# Patient Record
Sex: Male | Born: 1990 | Hispanic: Yes | Marital: Single | Smoking: Current every day smoker
Health system: Southern US, Community
[De-identification: ages and names within clinical notes are randomized; demographics above are authoritative.]

## PROBLEM LIST (undated history)

## (undated) DIAGNOSIS — S8251XA Displaced fracture of medial malleolus of right tibia, initial encounter for closed fracture: Secondary | ICD-10-CM

## (undated) DIAGNOSIS — K3580 Unspecified acute appendicitis: Secondary | ICD-10-CM

## (undated) HISTORY — DX: Unspecified acute appendicitis: K35.80

---

## 2017-01-22 ENCOUNTER — Encounter
Admission: EM | Disposition: A | Discharge status: Home / Self Care | Payer: Self-pay | Source: Home / Self Care | Attending: General Surgery

## 2017-01-22 ENCOUNTER — Emergency Department: Payer: Self-pay | Admitting: Certified Registered Nurse Anesthetist

## 2017-01-22 ENCOUNTER — Inpatient Hospital Stay
Admission: EM | Admit: 2017-01-22 | Discharge: 2017-01-25 | DRG: 340 | Disposition: A | Discharge status: Home / Self Care | Payer: Self-pay | Attending: General Surgery | Admitting: General Surgery

## 2017-01-22 ENCOUNTER — Emergency Department: Payer: Self-pay

## 2017-01-22 ENCOUNTER — Encounter: Payer: Self-pay | Admitting: Emergency Medicine

## 2017-01-22 DIAGNOSIS — K3532 Acute appendicitis with perforation and localized peritonitis, without abscess: Secondary | ICD-10-CM | POA: Diagnosis present

## 2017-01-22 DIAGNOSIS — K353 Acute appendicitis with localized peritonitis: Principal | ICD-10-CM | POA: Diagnosis present

## 2017-01-22 DIAGNOSIS — K3580 Unspecified acute appendicitis: Secondary | ICD-10-CM

## 2017-01-22 HISTORY — PX: LAPAROSCOPIC APPENDECTOMY: SHX408

## 2017-01-22 LAB — COMPREHENSIVE METABOLIC PANEL
ALBUMIN: 4.7 g/dL (ref 3.5–5.0)
ALK PHOS: 92 U/L (ref 38–126)
ALT: 32 U/L (ref 17–63)
ANION GAP: 13 (ref 5–15)
AST: 33 U/L (ref 15–41)
BUN: 7 mg/dL (ref 6–20)
CALCIUM: 9.7 mg/dL (ref 8.9–10.3)
CO2: 23 mmol/L (ref 22–32)
Chloride: 100 mmol/L — ABNORMAL LOW (ref 101–111)
Creatinine, Ser: 1.03 mg/dL (ref 0.61–1.24)
GFR calc Af Amer: >60 mL/min (ref >60)
GFR calc non Af Amer: >60 mL/min (ref >60)
GLUCOSE: 148 mg/dL — AB (ref 65–99)
POTASSIUM: 3.7 mmol/L (ref 3.5–5.1)
SODIUM: 136 mmol/L (ref 135–145)
TOTAL PROTEIN: 9.1 g/dL — AB (ref 6.5–8.1)
Total Bilirubin: 1 mg/dL (ref 0.3–1.2)

## 2017-01-22 LAB — CBC
HCT: 47.7 % (ref 40.0–52.0)
HEMOGLOBIN: 16.2 g/dL (ref 13.0–18.0)
MCH: 31.5 pg (ref 26.0–34.0)
MCHC: 34 g/dL (ref 32.0–36.0)
MCV: 92.7 fL (ref 80.0–100.0)
Platelets: 184 10*3/uL (ref 150–440)
RBC: 5.14 MIL/uL (ref 4.40–5.90)
RDW: 12.7 % (ref 11.5–14.5)
WBC: 13.5 10*3/uL — AB (ref 3.8–10.6)

## 2017-01-22 LAB — LIPASE, BLOOD: Lipase: 14 U/L (ref 11–51)

## 2017-01-22 SURGERY — APPENDECTOMY, LAPAROSCOPIC
Anesthesia: General

## 2017-01-22 MED ORDER — LIDOCAINE HCL (CARDIAC) 20 MG/ML IV SOLN
INTRAVENOUS | Status: DC | PRN
  Administered 2017-01-22: 40 mg via INTRAVENOUS

## 2017-01-22 MED ORDER — DEXAMETHASONE SODIUM PHOSPHATE 10 MG/ML IJ SOLN
INTRAMUSCULAR | Status: DC | PRN
  Administered 2017-01-22: 10 mg via INTRAVENOUS

## 2017-01-22 MED ORDER — ROCURONIUM BROMIDE 100 MG/10ML IV SOLN
INTRAVENOUS | Status: DC | PRN
  Administered 2017-01-22: 40 mg via INTRAVENOUS

## 2017-01-22 MED ORDER — LIDOCAINE HCL (PF) 1 % IJ SOLN
INTRAMUSCULAR | Status: AC
  Filled 2017-01-22: qty 30

## 2017-01-22 MED ORDER — LIDOCAINE HCL 1 % IJ SOLN
INTRAMUSCULAR | Status: DC | PRN
  Administered 2017-01-22: 7.5 mL

## 2017-01-22 MED ORDER — FENTANYL CITRATE (PF) 100 MCG/2ML IJ SOLN
75.0000 ug | Freq: Once | INTRAMUSCULAR | Status: AC
  Administered 2017-01-22: 75 ug via INTRAVENOUS
  Filled 2017-01-22: qty 2

## 2017-01-22 MED ORDER — BUPIVACAINE-EPINEPHRINE (PF) 0.25% -1:200000 IJ SOLN
INTRAMUSCULAR | Status: DC | PRN
  Administered 2017-01-22: 7.5 mL via PERINEURAL

## 2017-01-22 MED ORDER — PROPOFOL 10 MG/ML IV BOLUS
INTRAVENOUS | Status: DC | PRN
  Administered 2017-01-22: 160 mg via INTRAVENOUS

## 2017-01-22 MED ORDER — ONDANSETRON HCL 4 MG/2ML IJ SOLN
INTRAMUSCULAR | Status: DC | PRN
  Administered 2017-01-22: 4 mg via INTRAVENOUS

## 2017-01-22 MED ORDER — KETOROLAC TROMETHAMINE 30 MG/ML IJ SOLN
INTRAMUSCULAR | Status: DC | PRN
  Administered 2017-01-22: 30 mg via INTRAVENOUS

## 2017-01-22 MED ORDER — SODIUM CHLORIDE 0.9 % IV BOLUS (SEPSIS)
1000.0000 mL | Freq: Once | INTRAVENOUS | Status: AC
  Administered 2017-01-22: 1000 mL via INTRAVENOUS

## 2017-01-22 MED ORDER — FENTANYL CITRATE (PF) 100 MCG/2ML IJ SOLN
25.0000 ug | INTRAMUSCULAR | Status: DC | PRN
  Administered 2017-01-22: 25 ug via INTRAVENOUS

## 2017-01-22 MED ORDER — LACTATED RINGERS IV SOLN
INTRAVENOUS | Status: DC | PRN
  Administered 2017-01-22: 23:00:00 via INTRAVENOUS

## 2017-01-22 MED ORDER — PIPERACILLIN-TAZOBACTAM 3.375 G IVPB 30 MIN
3.3750 g | Freq: Once | INTRAVENOUS | Status: AC
  Administered 2017-01-22: 3.375 g via INTRAVENOUS

## 2017-01-22 MED ORDER — FENTANYL CITRATE (PF) 100 MCG/2ML IJ SOLN
INTRAMUSCULAR | Status: AC
  Filled 2017-01-22: qty 2

## 2017-01-22 MED ORDER — SUGAMMADEX SODIUM 200 MG/2ML IV SOLN
INTRAVENOUS | Status: DC | PRN
  Administered 2017-01-22: 154.2 mg via INTRAVENOUS

## 2017-01-22 MED ORDER — ONDANSETRON HCL 4 MG/2ML IJ SOLN: 4.0000 mg | Freq: Once | INTRAMUSCULAR | Status: DC | PRN

## 2017-01-22 MED ORDER — SUCCINYLCHOLINE CHLORIDE 20 MG/ML IJ SOLN
INTRAMUSCULAR | Status: DC | PRN
  Administered 2017-01-22: 120 mg via INTRAVENOUS

## 2017-01-22 MED ORDER — PIPERACILLIN-TAZOBACTAM 3.375 G IVPB 30 MIN
INTRAVENOUS | Status: AC
  Administered 2017-01-22: 3.375 g via INTRAVENOUS
  Filled 2017-01-22: qty 50

## 2017-01-22 MED ORDER — FENTANYL CITRATE (PF) 100 MCG/2ML IJ SOLN
25.0000 ug | INTRAMUSCULAR | Status: DC | PRN
Start: 2017-01-22 — End: 2017-01-23
  Administered 2017-01-22 – 2017-01-23 (×5): 25 ug via INTRAVENOUS

## 2017-01-22 MED ORDER — PHENYLEPHRINE HCL 10 MG/ML IJ SOLN
INTRAMUSCULAR | Status: DC | PRN
  Administered 2017-01-22: 100 ug via INTRAVENOUS

## 2017-01-22 MED ORDER — FENTANYL CITRATE (PF) 100 MCG/2ML IJ SOLN
INTRAMUSCULAR | Status: DC | PRN
  Administered 2017-01-22 (×2): 50 ug via INTRAVENOUS

## 2017-01-22 MED ORDER — HYDROMORPHONE HCL 1 MG/ML IJ SOLN
0.5000 mg | Freq: Once | INTRAMUSCULAR | Status: AC
  Administered 2017-01-22: 0.5 mg via INTRAVENOUS
  Filled 2017-01-22: qty 1

## 2017-01-22 MED ORDER — ONDANSETRON HCL 4 MG/2ML IJ SOLN
4.0000 mg | Freq: Once | INTRAMUSCULAR | Status: AC
  Administered 2017-01-22: 4 mg via INTRAVENOUS
  Filled 2017-01-22: qty 2

## 2017-01-22 MED ORDER — SODIUM CHLORIDE 0.9 % IV SOLN
INTRAVENOUS | Status: DC | PRN
  Administered 2017-01-22: 22:00:00 via INTRAVENOUS

## 2017-01-22 MED ORDER — ACETAMINOPHEN 10 MG/ML IV SOLN
INTRAVENOUS | Status: DC | PRN
  Administered 2017-01-22: 1000 mg via INTRAVENOUS

## 2017-01-22 MED ORDER — ACETAMINOPHEN 10 MG/ML IV SOLN
INTRAVENOUS | Status: AC
  Filled 2017-01-22: qty 100

## 2017-01-22 MED ORDER — BUPIVACAINE-EPINEPHRINE (PF) 0.25% -1:200000 IJ SOLN
INTRAMUSCULAR | Status: AC
  Filled 2017-01-22: qty 30

## 2017-01-22 SURGICAL SUPPLY — 46 items
ADHESIVE MASTISOL STRL (MISCELLANEOUS) ×3 IMPLANT
APPLIER CLIP 5 13 M/L LIGAMAX5 (MISCELLANEOUS) ×3
BLADE SURG SZ11 CARB STEEL (BLADE) ×3 IMPLANT
BULB RESERV EVAC DRAIN JP 100C (MISCELLANEOUS) ×3 IMPLANT
CANISTER SUCT 1200ML W/VALVE (MISCELLANEOUS) ×3 IMPLANT
CHLORAPREP W/TINT 26ML (MISCELLANEOUS) ×3 IMPLANT
CLIP APPLIE 5 13 M/L LIGAMAX5 (MISCELLANEOUS) ×1 IMPLANT
CLOSURE WOUND 1/2 X4 (GAUZE/BANDAGES/DRESSINGS) ×1
CUTTER FLEX LINEAR 45M (STAPLE) ×3 IMPLANT
DRAIN CHANNEL JP 19F (MISCELLANEOUS) ×3 IMPLANT
DRSG TEGADERM 2-3/8X2-3/4 SM (GAUZE/BANDAGES/DRESSINGS) ×9 IMPLANT
DRSG TEGADERM 4X4.75 (GAUZE/BANDAGES/DRESSINGS) ×3 IMPLANT
DRSG TELFA 4X3 1S NADH ST (GAUZE/BANDAGES/DRESSINGS) ×3 IMPLANT
ELECT REM PT RETURN 9FT ADLT (ELECTROSURGICAL) ×3
ELECTRODE REM PT RTRN 9FT ADLT (ELECTROSURGICAL) ×1 IMPLANT
GLOVE BIO SURGEON STRL SZ7.5 (GLOVE) ×6 IMPLANT
GLOVE INDICATOR 8.0 STRL GRN (GLOVE) ×6 IMPLANT
GOWN STRL REUS W/ TWL LRG LVL3 (GOWN DISPOSABLE) ×2 IMPLANT
GOWN STRL REUS W/TWL LRG LVL3 (GOWN DISPOSABLE) ×4
IRRIGATION STRYKERFLOW (MISCELLANEOUS) ×1 IMPLANT
IRRIGATOR STRYKERFLOW (MISCELLANEOUS) ×3
IV NS 1000ML (IV SOLUTION) ×2
IV NS 1000ML BAXH (IV SOLUTION) ×1 IMPLANT
KIT RM TURNOVER STRD PROC AR (KITS) ×3 IMPLANT
LABEL OR SOLS (LABEL) IMPLANT
NEEDLE HYPO 25X1 1.5 SAFETY (NEEDLE) ×3 IMPLANT
NEEDLE VERESS 14GA 120MM (NEEDLE) ×3 IMPLANT
NS IRRIG 500ML POUR BTL (IV SOLUTION) ×3 IMPLANT
PACK LAP CHOLECYSTECTOMY (MISCELLANEOUS) ×3 IMPLANT
POUCH SPECIMEN RETRIEVAL 10MM (ENDOMECHANICALS) ×3 IMPLANT
RELOAD 45 VASCULAR/THIN (ENDOMECHANICALS) IMPLANT
RELOAD STAPLE TA45 3.5 REG BLU (ENDOMECHANICALS) ×3 IMPLANT
SCALPEL HARMONIC ACE (MISCELLANEOUS) ×3 IMPLANT
SLEEVE ENDOPATH XCEL 5M (ENDOMECHANICALS) ×3 IMPLANT
SLEEVE SCD COMPRESS THIGH MED (MISCELLANEOUS) ×3 IMPLANT
SPONGE DRAIN TRACH 4X4 STRL 2S (GAUZE/BANDAGES/DRESSINGS) ×3 IMPLANT
STRIP CLOSURE SKIN 1/2X4 (GAUZE/BANDAGES/DRESSINGS) ×2 IMPLANT
SUT ETHILON 3-0 (SUTURE) ×3 IMPLANT
SUT MNCRL 4-0 (SUTURE) ×2
SUT MNCRL 4-0 27XMFL (SUTURE) ×1
SUT VICRYL 0 AB UR-6 (SUTURE) ×3 IMPLANT
SUTURE MNCRL 4-0 27XMF (SUTURE) ×1 IMPLANT
TRAY FOLEY W/METER SILVER 16FR (SET/KITS/TRAYS/PACK) ×3 IMPLANT
TROCAR XCEL 12X100 BLDLESS (ENDOMECHANICALS) ×3 IMPLANT
TROCAR XCEL NON-BLD 5MMX100MML (ENDOMECHANICALS) ×3 IMPLANT
TUBING INSUFFLATOR HI FLOW (MISCELLANEOUS) ×3 IMPLANT

## 2017-01-22 NOTE — Brief Op Note (Signed)
01/22/2017  11:17 PM  PATIENT:  Sean Ayers  26 y.o. male  PRE-OPERATIVE DIAGNOSIS:  appendicitis  POST-OPERATIVE DIAGNOSIS:  Ruptured appendicitis  PROCEDURE:  Procedure(s): APPENDECTOMY LAPAROSCOPIC (N/A)  SURGEON:  Surgeon(s) and Role:    Ricarda Frame* Tiona Ruane, MD - Primary  PHYSICIAN ASSISTANT:   ASSISTANTS: none   ANESTHESIA:   general  EBL:  Total I/O In: 1000 [I.V.:1000] Out: 130 [Urine:100; Blood:30]  BLOOD ADMINISTERED:none  DRAINS: ([id number]) Blake drain(s) in the pelvis   LOCAL MEDICATIONS USED:  MARCAINE   , XYLOCAINE  and Amount: 15 ml  SPECIMEN:  Source of Specimen:  appendix  DISPOSITION OF SPECIMEN:  PATHOLOGY  COUNTS:  YES  TOURNIQUET:  * No tourniquets in log *  DICTATION: .Dragon Dictation  PLAN OF CARE: Admit to inpatient   PATIENT DISPOSITION:  PACU - hemodynamically stable.   Delay start of Pharmacological VTE agent (>24hrs) due to surgical blood loss or risk of bleeding: no

## 2017-01-22 NOTE — ED Notes (Signed)
MD to bedside at this time 

## 2017-01-22 NOTE — Anesthesia Preprocedure Evaluation (Signed)
Anesthesia Evaluation  Patient identified by MRN, date of birth, ID band Patient awake    Reviewed: Allergy & Precautions, NPO status , Patient's Chart, lab work & pertinent test results  Airway Mallampati: II  TM Distance: >3 FB     Dental  (+) Teeth Intact   Pulmonary neg pulmonary ROS,    Pulmonary exam normal        Cardiovascular negative cardio ROS Normal cardiovascular exam     Neuro/Psych negative neurological ROS  negative psych ROS   GI/Hepatic Neg liver ROS,   Endo/Other  negative endocrine ROS  Renal/GU negative Renal ROS  negative genitourinary   Musculoskeletal negative musculoskeletal ROS (+)   Abdominal Normal abdominal exam  (+)   Peds negative pediatric ROS (+)  Hematology negative hematology ROS (+)   Anesthesia Other Findings   Reproductive/Obstetrics                             Anesthesia Physical Anesthesia Plan  ASA: I and emergent  Anesthesia Plan: General   Post-op Pain Management:    Induction: Rapid sequence and Cricoid pressure planned  PONV Risk Score and Plan: 3 and Ondansetron, Dexamethasone, Midazolam and Propofol infusion  Airway Management Planned: Oral ETT  Additional Equipment:   Intra-op Plan:   Post-operative Plan: Extubation in OR  Informed Consent: I have reviewed the patients History and Physical, chart, labs and discussed the procedure including the risks, benefits and alternatives for the proposed anesthesia with the patient or authorized representative who has indicated his/her understanding and acceptance.   Dental advisory given  Plan Discussed with: CRNA and Surgeon  Anesthesia Plan Comments:         Anesthesia Quick Evaluation

## 2017-01-22 NOTE — ED Notes (Signed)
Pt no longer rolling around in the bed yelling "owie owie my stomach is killing me!" lights dimmed for patient comfort, pt given 2 warm blankets. Will continue to monitor for further patient needs. CT at bedside to take patient for CT scan.

## 2017-01-22 NOTE — Transfer of Care (Signed)
Immediate Anesthesia Transfer of Care Note  Patient: Sean Ayers  Procedure(s) Performed: Procedure(s): APPENDECTOMY LAPAROSCOPIC (N/A)  Patient Location: PACU  Anesthesia Type:General  Level of Consciousness: awake, alert  and oriented  Airway & Oxygen Therapy: Patient Spontanous Breathing and Patient connected to face mask oxygen  Post-op Assessment: Report given to RN and Post -op Vital signs reviewed and stable  Post vital signs: Reviewed and stable  Last Vitals:  Vitals:   01/22/17 1940 01/22/17 2000  BP: (!) 142/105 128/80  Pulse: 72 74  Resp: 18   Temp: 36.5 C   SpO2: 100% 100%    Last Pain:  Vitals:   01/22/17 2102  TempSrc:   PainSc: 10-Worst pain ever         Complications: No apparent anesthesia complications

## 2017-01-22 NOTE — Anesthesia Postprocedure Evaluation (Signed)
Anesthesia Post Note  Patient: Sean Ayers  Procedure(s) Performed: Procedure(s) (LRB): APPENDECTOMY LAPAROSCOPIC (N/A)  Patient location during evaluation: PACU Anesthesia Type: General Level of consciousness: awake and alert and oriented Pain management: pain level controlled Vital Signs Assessment: post-procedure vital signs reviewed and stable Respiratory status: spontaneous breathing Cardiovascular status: blood pressure returned to baseline Anesthetic complications: no     Last Vitals:  Vitals:   01/22/17 2329 01/22/17 2330  BP: 138/71   Pulse: 98 93  Resp: (!) 22 (!) 26  Temp: (P) 37.6 C   SpO2: 100% 100%    Last Pain:  Vitals:   01/22/17 2102  TempSrc:   PainSc: 10-Worst pain ever                 Tavon Magnussen

## 2017-01-22 NOTE — Op Note (Signed)
laparascopic appendectomy   Sean Shortsorey Baumgarner Date of operation:  01/22/2017  Indications: The patient presented with a history of  abdominal pain. Workup has revealed findings consistent with acute appendicitis.  Pre-operative Diagnosis: Acute appendicitis with generalized peritonitis  Post-operative Diagnosis: Acute appendicitis with peritoneal abscess  Surgeon: Leonette Mostharles T. Tonita CongWoodham, MD, FACS  Anesthesia: General with endotracheal tube  Procedure Details  The patient was seen again in the preop area. The options of surgery versus observation were reviewed with the patient and/or family. The risks of bleeding, infection, recurrence of symptoms, negative laparoscopy, potential for an open procedure, bowel injury, abscess or infection, were all reviewed as well. The patient was taken to Operating Room, identified as Sean Ayers and the procedure verified as laparoscopic appendectomy. A Time Out was held and the above information confirmed.  The patient was placed in the supine position and general anesthesia was induced.  Antibiotic prophylaxis was administered and VT E prophylaxis was in place. A Foley catheter was placed by the nursing staff.   The abdomen was prepped and draped in a sterile fashion. An infraumbilical incision was made after localization with a 50-50 mixture of 1% lidocaine 0.5% Marcaine plain.. A Veress needle was placed and pneumoperitoneum was obtained. A 5 mm trocar port was placed without difficulty and the abdominal cavity was explored.  Under direct vision a 5 mm suprapubic port was placed and a 12 mm left lateral port was placed all under direct vision.  The appendix was identified and found to be acutely inflamed within the pelvis and adhered to the lateral sidewall. There is copious amounts of inflammatory and purulent fluid within the pelvis and right lower quadrant. As the appendix was dissected out it was found to be perforated in the mid body of the appendix.   The  appendix was carefully dissected. The base of the appendix was dissected out and divided with a standard load Endo GIA. The mesoappendix was divided with the use of a harmonic scalpel. There is an additional area of bleeding along the mesial appendix that was controlled with endoclips.  The appendix was passed out through the left lateral port site with the aid of an Endo Catch bag. The right lower quadrant and pelvis was then irrigated with copious amounts of normal saline which was aspirated. Inspection failed to identify any additional bleeding and there were no signs of bowel injury. Given the ruptured status of the appendix and the copious purulent fluid, the decision was made to place a 19 [place] round Blake drain. It was inserted into the abdomen through the left lower quadrant trocar and pulled out through the low midline. It was placed in the pelvis and the right paracolic gutter under direct visualization. The drain was secured to the skin with a 3-0 nylon.   Again the right lower quadrant was inspected there was no sign of bleeding or bowel injury therefore pneumoperitoneum was released, all ports were removed and re-localized the aforementioned local anesthetic. The left lower quadrant trocar site fascia was closed with a 0 Vicryl suture in a figure-of-eight fashion. The skin incisions were approximated with subcuticular 4-0 Monocryl. Steri-Strips and Mastisol and sterile dressings were placed.  The patient tolerated the procedure well, there were no complications. The sponge lap and needle count were correct at the end of the procedure.  The patient was taken to the recovery room in stable condition to be admitted for continued care.  Findings: Ruptured appendicitis  Estimated Blood Loss: 20 mL  Specimens: appendix         Complications:  None                  Clayburn Pert MD, FACS

## 2017-01-22 NOTE — ED Triage Notes (Signed)
Pt to triage via w/c, diaphoretic, moaning; c/o lower abd pain since this morning with N/V; denies hx of same

## 2017-01-22 NOTE — H&P (Signed)
Patient ID: Sean Ayers, male   DOB: 1990-11-17, 26 y.o.   MRN: [id number]  CC: Abdominal pain  HPI Sean Ayers is a 26 y.o. male who presents to the ER with a 1 day history of abdominal pain. Patient reports the pain started in his lower abdomen and has remained there throughout the day. It has progressively worsened to the point where it prompted him to the emergency department. He has not had any appetite today force himself to take a sip of water earlier today. He has not eaten any food since yesterday. He states he has had subjective fevers and chills but has not taken his temperature. He has had some nausea but denies any vomiting. He denies any chest pain, shortness breath, diarrhea, constipation. Prior to this pain he was in his usual state of health. He denies any recent sick contacts or travel.  HPI  History reviewed. No pertinent past medical history. Patient currently on no medications.  History reviewed. No pertinent surgical history. Patient states he has never had any surgery.  Family history: Uncle with brain cancer of unknown type. No other family history of cardiac disease or diabetes.  Social History Social History  Substance Use Topics  . Smoking status: Never Smoker  . Smokeless tobacco: Never Used  . Alcohol use No    No Known Allergies  Current Facility-Administered Medications  Medication Dose Route Frequency Provider Last Rate Last Dose  . piperacillin-tazobactam (ZOSYN) IVPB 3.375 g  3.375 g Intravenous Once Rockne Menghini, MD 100 mL/hr at 01/22/17 2108 3.375 g at 01/22/17 2108   No current outpatient prescriptions on file.     Review of Systems A Multi-point review of systems was asked and was negative except for the findings documented in the history of present illness  Physical Exam Blood pressure 128/80, pulse 74, temperature 97.7 F (36.5 C), temperature source Oral, resp. rate 18, height  (1.753 m), weight 77.1 kg (170 lb), SpO2 100  %. CONSTITUTIONAL: Resting in bed in obvious discomfort. EYES: Pupils are equal, round, and reactive to light, Sclera are non-icteric. EARS, NOSE, MOUTH AND THROAT: The oropharynx is clear. The oral mucosa is pink and moist. Hearing is intact to voice. LYMPH NODES:  Lymph nodes in the neck are normal. RESPIRATORY:  Lungs are clear. There is normal respiratory effort, with equal breath sounds bilaterally, and without pathologic use of accessory muscles. CARDIOVASCULAR: Heart is regular without murmurs, gallops, or rubs. GI: The abdomen is soft, tender to palpation in bilateral lower quadrants worse in the right lower quadrant, and nondistended. There are no palpable masses. There is no hepatosplenomegaly. There are normal bowel sounds in all quadrants. GU: Rectal deferred.   MUSCULOSKELETAL: Normal muscle strength and tone. No cyanosis or edema.   SKIN: Turgor is good and there are no pathologic skin lesions or ulcers. NEUROLOGIC: Motor and sensation is grossly normal. Cranial nerves are grossly intact. PSYCH:  Oriented to person, place and time. Affect is normal.  Data Reviewed Images and labs reviewed which show a leukocytosis of 13.5, remainder of labs are within normal limits. CT scan of the abdomen performed without contrast shows a dilated, thickened, inflamed appendix in the right side of the pelvis. Due to the petite body habitus and lack of contract it is difficult to ascertain whether not present evidence of rupture or not. I have personally reviewed the patient's imaging, laboratory findings and medical records.    Assessment    Acute appendicitis    Plan  26 year old male with acute appendicitis. Discussed treatment options of surgery versus antibiotic therapy for appendicitis. The risks, benefits, alternatives above described in detail, after which patient states he wants surgery tonight. Discussed the procedure of a laparoscopic appendectomy in details with the patient and his  family were in the room. The primary risk is of damage to the surrounding structures, possible conversion to open, possible need for a drain for rupture, possible need for prolonged hospital stay. The primary benefit is to remove the source of appendicitis. Patient voiced understanding and desires to proceed. We will go to the operating room emergently from the ER for laparoscopic appendectomy. He will then be brought into the hospital postoperatively for continued resuscitation, antibiotics, care. He understands that the length of the stay will be determined by the severity of his appendicitis.     Time spent with the patient was 50 minutes, with more than 50% of the time spent in face-to-face education, counseling and care coordination.     Ricarda Frameharles Haisley Arens, MD FACS General Surgeon 01/22/2017, 9:19 PM

## 2017-01-22 NOTE — ED Notes (Signed)
Dr. Woodham at bedside at this time.  

## 2017-01-22 NOTE — ED Provider Notes (Signed)
[facility] Emergency Department Provider Note  ____________________________________________  Time seen: Approximately 8:07 PM  I have reviewed the triage vital signs and the nursing notes.   HISTORY  Chief Complaint Abdominal Pain    HPI Sean Ayers is a 26 y.o. male , otherwise healthy, presenting with lower abdominal pain, nausea and vomiting, and stool. The patient reports that since this morning, he has had progressively worsening sharp lower abdominal pains associated with multiple episodes of vomiting and loose, nonbloody stool. He has not had any fever or chills. He has not had any urinary symptoms, testicular or scrotal pain.   History reviewed. No pertinent past medical history.  There are no active problems to display for this patient.   History reviewed. No pertinent surgical history.    Allergies Patient has no known allergies.  No family history on file.  Social History Social History  Substance Use Topics  . Smoking status: Never Smoker  . Smokeless tobacco: Never Used  . Alcohol use No    Review of Systems Constitutional: No fever/chills.No lightheadedness or syncope. Eyes: No visual changes. ENT: No sore throat. No congestion or rhinorrhea. Cardiovascular: Denies chest pain. Denies palpitations. Respiratory: Denies shortness of breath.  No cough. Gastrointestinal: Positive lower abdominal pain.  Positive nausea, positive vomiting.  Positive diarrhea.  No constipation. Genitourinary: Negative for dysuria. No penile, scrotal or testicular pain. Musculoskeletal: Negative for back pain. Skin: Negative for rash. Neurological: Negative for headaches. No focal numbness, tingling or weakness.     ____________________________________________   PHYSICAL EXAM:  VITAL SIGNS: ED Triage Vitals  Enc Vitals Group     BP 01/22/17 1940 (!) 142/105     Pulse Rate 01/22/17 1940 72     Resp 01/22/17 1940 18     Temp 01/22/17  1940 97.7 F (36.5 C)     Temp Source 01/22/17 1940 Oral     SpO2 01/22/17 1940 100 %     Weight 01/22/17 1940 170 lb (77.1 kg)     Height 01/22/17 1940 5\' 9"  (1.753 m)     Head Circumference --      Peak Flow --      Pain Score 01/22/17 1941 10     Pain Loc --      Pain Edu? --      Excl. in GC? --     Constitutional: Alert and oriented. Uncomfortable appearing and doubled over, has difficulty laying flat for my examination.Marland Kitchen. Answers questions appropriately. Eyes: Conjunctivae are normal.  EOMI. No scleral icterus. Head: Atraumatic. Nose: No congestion/rhinnorhea. Mouth/Throat: Mucous membranes are moist.  Neck: No stridor.  Supple.   Cardiovascular: Normal rate, regular rhythm. No murmurs, rubs or gallops.  Respiratory: Normal respiratory effort.  No accessory muscle use or retractions. Lungs CTAB.  No wheezes, rales or ronchi. Gastrointestinal: Soft, and nondistended.  Diffuse tenderness to palpation in the lower abdomen without focality. No guarding or rebound.  No peritoneal signs. Genitourinary: Normal-appearing male genitalia. No discharge or from the penis or lesions on the penis. Normal lie of the testicles. No tenderness to palpation or palpable masses on the testicles. Negative for inguinal hernias bilaterally. Musculoskeletal: No LE edema.  Neurologic:  A&Ox3.  Speech is clear.  Face and smile are symmetric.  EOMI.  Moves all extremities well. Skin:  Skin is warm, dry and intact. No rash noted. Psychiatric: Mood and affect are normal. Speech and behavior are normal.  Normal judgement.  ____________________________________________   LABS (all labs ordered are  listed, but only abnormal results are displayed)  Labs Reviewed  CBC - Abnormal; Notable for the following:       Result Value   WBC 13.5 (*)    All other components within normal limits  COMPREHENSIVE METABOLIC PANEL - Abnormal; Notable for the following:    Chloride 100 (*)    Glucose, Bld 148 (*)    Total  Protein 9.1 (*)    All other components within normal limits  LIPASE, BLOOD  URINALYSIS, COMPLETE (UACMP) WITH MICROSCOPIC   ____________________________________________  EKG  Not indicated ____________________________________________  RADIOLOGY  Ct Renal Stone Study  Result Date: 01/22/2017 CLINICAL DATA:  Lower abdomen pain since this morning with nausea and vomiting. EXAM: CT ABDOMEN AND PELVIS WITHOUT CONTRAST TECHNIQUE: Multidetector CT imaging of the abdomen and pelvis was performed following the standard protocol without IV contrast. COMPARISON:  None. FINDINGS: Lower chest: No acute abnormality. Hepatobiliary: No focal liver abnormality is seen. No gallstones, gallbladder wall thickening, or biliary dilatation. Pancreas: Unremarkable. No pancreatic ductal dilatation or surrounding inflammatory changes. Spleen: Normal in size without focal abnormality. Adrenals/Urinary Tract: Adrenal glands are unremarkable. Kidneys are normal, without renal calculi, focal lesion, or hydronephrosis. Bladder is unremarkable. Stomach/Bowel: There is a blind ending tubular structure extending from the cecum with thickened enhancing wall and surrounding fluid and inflammation. This is consistent with appendicitis. There is no small bowel obstruction or diverticulitis. There is no free air. Vascular/Lymphatic: No significant vascular findings are present. No enlarged abdominal or pelvic lymph nodes. Reproductive: Prostate is unremarkable. Other: None. Musculoskeletal: No acute or significant osseous findings. IMPRESSION: Findings consistent with appendicitis. These results will be called to the ordering clinician or representative by the Radiologist Assistant, and communication documented in the PACS or zVision Dashboard. Electronically Signed   By: Sherian Rein M.D.   On: 01/22/2017 20:33    ____________________________________________   PROCEDURES  Procedure(s) performed: None  Procedures  Critical  Care performed: No ____________________________________________   INITIAL IMPRESSION / ASSESSMENT AND PLAN / ED COURSE  Pertinent labs & imaging results that were available during my care of the patient were reviewed by me and considered in my medical decision making (see chart for details).  26 y.o. L with progressively worsening lower abdominal pain associated nausea vomiting and diarrhea. It is possible that the patient has a viral or foodborne GI illness. I would also consider appendicitis. Gallbladder disease is less likely given that the pain is in the lower abdomen. I do not see any evidence of a GU pathology for the patient's symptoms. Plan CT scan, laboratory studies, and symptomatic treatment. Plan reevaluation for final disposition.  ----------------------------------------- 8:54 PM on 01/22/2017 -----------------------------------------  The patient's CT scan is positive for acute appendicitis. Dr. Tonita Cong, the general surgeon on call, has been notified and will admit the patient for further management. The patient will be given Zosyn. At this time, his symptoms have improved but he continues to have some pain so will continue to treat his pain.  ____________________________________________  FINAL CLINICAL IMPRESSION(S) / ED DIAGNOSES  Final diagnoses:  Acute appendicitis, unspecified acute appendicitis type         NEW MEDICATIONS STARTED DURING THIS VISIT:  New Prescriptions   No medications on file      Rockne Menghini, MD 01/22/17 2055

## 2017-01-22 NOTE — Anesthesia Procedure Notes (Signed)
Procedure Name: Intubation Performed by: Demetrius Charity Pre-anesthesia Checklist: Patient identified, Patient being monitored, Timeout performed, Emergency Drugs available and Suction available Patient Re-evaluated:Patient Re-evaluated prior to induction Oxygen Delivery Method: Circle system utilized Preoxygenation: Pre-oxygenation with 100% oxygen Induction Type: IV induction, Rapid sequence and Cricoid Pressure applied Laryngoscope Size: Mac and 4 Grade View: Grade I Tube type: Oral Tube size: 7.5 mm Number of attempts: 1 Airway Equipment and Method: Stylet Placement Confirmation: ETT inserted through vocal cords under direct vision,  positive ETCO2 and breath sounds checked- equal and bilateral Secured at: 23 cm Tube secured with: Tape Dental Injury: Teeth and Oropharynx as per pre-operative assessment

## 2017-01-22 NOTE — Anesthesia Post-op Follow-up Note (Signed)
Anesthesia QCDR form completed.        

## 2017-01-23 ENCOUNTER — Encounter: Payer: Self-pay | Admitting: General Surgery

## 2017-01-23 LAB — BASIC METABOLIC PANEL
ANION GAP: 7 (ref 5–15)
BUN: 9 mg/dL (ref 6–20)
CHLORIDE: 103 mmol/L (ref 101–111)
CO2: 26 mmol/L (ref 22–32)
CREATININE: 0.98 mg/dL (ref 0.61–1.24)
Calcium: 8.8 mg/dL — ABNORMAL LOW (ref 8.9–10.3)
GFR calc non Af Amer: >60 mL/min (ref >60)
Glucose, Bld: 187 mg/dL — ABNORMAL HIGH (ref 65–99)
POTASSIUM: 4 mmol/L (ref 3.5–5.1)
SODIUM: 136 mmol/L (ref 135–145)

## 2017-01-23 LAB — CBC
HEMATOCRIT: 40.2 % (ref 40.0–52.0)
Hemoglobin: 13.9 g/dL (ref 13.0–18.0)
MCH: 32.1 pg (ref 26.0–34.0)
MCHC: 34.6 g/dL (ref 32.0–36.0)
MCV: 92.8 fL (ref 80.0–100.0)
Platelets: 140 10*3/uL — ABNORMAL LOW (ref 150–440)
RBC: 4.33 MIL/uL — ABNORMAL LOW (ref 4.40–5.90)
RDW: 12.8 % (ref 11.5–14.5)
WBC: 15.6 10*3/uL — ABNORMAL HIGH (ref 3.8–10.6)

## 2017-01-23 MED ORDER — NICOTINE 14 MG/24HR TD PT24
14.0000 mg | MEDICATED_PATCH | Freq: Every day | TRANSDERMAL | Status: DC
  Administered 2017-01-23 – 2017-01-24 (×2): 14 mg via TRANSDERMAL
  Filled 2017-01-23 (×2): qty 1

## 2017-01-23 MED ORDER — DIPHENHYDRAMINE HCL 50 MG/ML IJ SOLN: 25.0000 mg | Freq: Four times a day (QID) | INTRAMUSCULAR | Status: DC | PRN

## 2017-01-23 MED ORDER — MORPHINE SULFATE (PF) 4 MG/ML IV SOLN
4.0000 mg | INTRAVENOUS | Status: DC | PRN
  Administered 2017-01-23 (×3): 4 mg via INTRAVENOUS
  Filled 2017-01-23 (×3): qty 1

## 2017-01-23 MED ORDER — FENTANYL CITRATE (PF) 100 MCG/2ML IJ SOLN
INTRAMUSCULAR | Status: AC
  Filled 2017-01-23: qty 2

## 2017-01-23 MED ORDER — DEXTROSE IN LACTATED RINGERS 5 % IV SOLN
INTRAVENOUS | Status: DC
  Administered 2017-01-23 – 2017-01-24 (×3): via INTRAVENOUS

## 2017-01-23 MED ORDER — PANTOPRAZOLE SODIUM 40 MG IV SOLR
40.0000 mg | INTRAVENOUS | Status: DC
  Administered 2017-01-23 – 2017-01-24 (×2): 40 mg via INTRAVENOUS
  Filled 2017-01-23 (×2): qty 40

## 2017-01-23 MED ORDER — ONDANSETRON 4 MG PO TBDP: 4.0000 mg | ORAL_TABLET | Freq: Four times a day (QID) | ORAL | Status: DC | PRN

## 2017-01-23 MED ORDER — PNEUMOCOCCAL VAC POLYVALENT 25 MCG/0.5ML IJ INJ: 0.5000 mL | INJECTION | INTRAMUSCULAR | Status: DC

## 2017-01-23 MED ORDER — PIPERACILLIN-TAZOBACTAM 3.375 G IVPB
3.3750 g | Freq: Three times a day (TID) | INTRAVENOUS | Status: DC
  Administered 2017-01-23 – 2017-01-24 (×5): 3.375 g via INTRAVENOUS
  Filled 2017-01-23 (×5): qty 50

## 2017-01-23 MED ORDER — HYDRALAZINE HCL 20 MG/ML IJ SOLN: 10.0000 mg | INTRAMUSCULAR | Status: DC | PRN

## 2017-01-23 MED ORDER — ENOXAPARIN SODIUM 40 MG/0.4ML ~~LOC~~ SOLN
40.0000 mg | SUBCUTANEOUS | Status: DC
  Administered 2017-01-23 – 2017-01-24 (×2): 40 mg via SUBCUTANEOUS
  Filled 2017-01-23 (×2): qty 0.4

## 2017-01-23 MED ORDER — HYDROMORPHONE HCL 1 MG/ML IJ SOLN
0.5000 mg | INTRAMUSCULAR | Status: DC | PRN
  Administered 2017-01-23 – 2017-01-25 (×8): 0.5 mg via INTRAVENOUS
  Filled 2017-01-23 (×8): qty 0.5

## 2017-01-23 MED ORDER — ONDANSETRON HCL 4 MG/2ML IJ SOLN: 4.0000 mg | Freq: Four times a day (QID) | INTRAMUSCULAR | Status: DC | PRN

## 2017-01-23 MED ORDER — GI COCKTAIL ~~LOC~~
30.0000 mL | Freq: Once | ORAL | Status: AC
  Administered 2017-01-23: 30 mL via ORAL
  Filled 2017-01-23: qty 30

## 2017-01-23 MED ORDER — OXYCODONE HCL 5 MG PO TABS
5.0000 mg | ORAL_TABLET | ORAL | Status: DC | PRN
  Administered 2017-01-23 (×4): 5 mg via ORAL
  Administered 2017-01-24 – 2017-01-25 (×5): 10 mg via ORAL
  Filled 2017-01-23: qty 1
  Filled 2017-01-23 (×2): qty 2
  Filled 2017-01-23 (×3): qty 1
  Filled 2017-01-23 (×3): qty 2

## 2017-01-23 MED ORDER — DIPHENHYDRAMINE HCL 25 MG PO CAPS: 25.0000 mg | ORAL_CAPSULE | Freq: Four times a day (QID) | ORAL | Status: DC | PRN

## 2017-01-23 NOTE — Progress Notes (Signed)
01/23/2017  Subjective: Patient is 1 Day Post-Op s/p laparoscopic appendectomy.  Reports he feels much better now compared to prior to surgery.  Had mild nausea post-op but feels ok this morning.  Vital signs: Temp:  [97.7 F (36.5 C)-99.7 F (37.6 C)] 99.3 F (37.4 C) (09/07 1220) Pulse Rate:  [63-98] 80 (09/07 1220) Resp:  [15-26] 18 (09/07 0525) BP: (97-142)/(47-105) 125/63 (09/07 1220) SpO2:  [94 %-100 %] 100 % (09/07 1220) Weight:  [77.1 kg (170 lb)] 77.1 kg (170 lb) (09/06 1940)   Intake/Output: 09/06 0701 - 09/07 0700 In: 1848 [P.O.:148; I.V.:1700] Out: 780 [Urine:700; Drains:50; Blood:30] Last BM Date: 01/22/17  Physical Exam: Constitutional: No acute distress Abdomen:  Soft, nondistended, appropriately tender to palpation.  Incisions are clean, dry.  JP drain with serosanguinous fluid.  Labs:   Recent Labs  01/22/17 1951 01/23/17 0429  WBC 13.5* 15.6*  HGB 16.2 13.9  HCT 47.7 40.2  PLT 184 140*    Recent Labs  01/22/17 1951 01/23/17 0429  NA 136 136  K 3.7 4.0  CL 100* 103  CO2 23 26  GLUCOSE 148* 187*  BUN 7 9  CREATININE 1.03 0.98  CALCIUM 9.7 8.8*   No results for input(s): LABPROT, INR in the last 72 hours.  Imaging: Ct Renal Stone Study  Result Date: 01/22/2017 CLINICAL DATA:  Lower abdomen pain since this morning with nausea and vomiting. EXAM: CT ABDOMEN AND PELVIS WITHOUT CONTRAST TECHNIQUE: Multidetector CT imaging of the abdomen and pelvis was performed following the standard protocol without IV contrast. COMPARISON:  None. FINDINGS: Lower chest: No acute abnormality. Hepatobiliary: No focal liver abnormality is seen. No gallstones, gallbladder wall thickening, or biliary dilatation. Pancreas: Unremarkable. No pancreatic ductal dilatation or surrounding inflammatory changes. Spleen: Normal in size without focal abnormality. Adrenals/Urinary Tract: Adrenal glands are unremarkable. Kidneys are normal, without renal calculi, focal lesion, or  hydronephrosis. Bladder is unremarkable. Stomach/Bowel: There is a blind ending tubular structure extending from the cecum with thickened enhancing wall and surrounding fluid and inflammation. This is consistent with appendicitis. There is no small bowel obstruction or diverticulitis. There is no free air. Vascular/Lymphatic: No significant vascular findings are present. No enlarged abdominal or pelvic lymph nodes. Reproductive: Prostate is unremarkable. Other: None. Musculoskeletal: No acute or significant osseous findings. IMPRESSION: Findings consistent with appendicitis. These results will be called to the ordering clinician or representative by the Radiologist Assistant, and communication documented in the PACS or zVision Dashboard. Electronically Signed   By: Sherian ReinWei-Chen  Lin M.D.   On: 01/22/2017 20:33    Assessment/Plan: 26 yo male s/p laparoscopic appendectomy  --Clear liquid diet today --Continue IV Zosyn for 24-48 hrs until WBC normalizes --Decrease IV fluid today and d/c when taking good po --OOB, ambulate.   Howie IllJose Luis Leni Pankonin, MD [facility]

## 2017-01-24 LAB — CBC
HCT: 37.3 % — ABNORMAL LOW (ref 40.0–52.0)
HEMOGLOBIN: 13.2 g/dL (ref 13.0–18.0)
MCH: 32.7 pg (ref 26.0–34.0)
MCHC: 35.5 g/dL (ref 32.0–36.0)
MCV: 92.3 fL (ref 80.0–100.0)
Platelets: 136 10*3/uL — ABNORMAL LOW (ref 150–440)
RBC: 4.04 MIL/uL — ABNORMAL LOW (ref 4.40–5.90)
RDW: 12.8 % (ref 11.5–14.5)
WBC: 13 10*3/uL — ABNORMAL HIGH (ref 3.8–10.6)

## 2017-01-24 LAB — BASIC METABOLIC PANEL WITH GFR
Anion gap: 7 (ref 5–15)
BUN: 8 mg/dL (ref 6–20)
CO2: 26 mmol/L (ref 22–32)
Calcium: 8.8 mg/dL — ABNORMAL LOW (ref 8.9–10.3)
Chloride: 103 mmol/L (ref 101–111)
Creatinine, Ser: 0.85 mg/dL (ref 0.61–1.24)
GFR calc Af Amer: >60 mL/min
GFR calc non Af Amer: >60 mL/min
Glucose, Bld: 117 mg/dL — ABNORMAL HIGH (ref 65–99)
Potassium: 3.8 mmol/L (ref 3.5–5.1)
Sodium: 136 mmol/L (ref 135–145)

## 2017-01-24 MED ORDER — AMOXICILLIN-POT CLAVULANATE 875-125 MG PO TABS
1.0000 | ORAL_TABLET | Freq: Two times a day (BID) | ORAL | Status: DC
  Administered 2017-01-24: 1 via ORAL
  Filled 2017-01-24: qty 1

## 2017-01-24 MED ORDER — KETOROLAC TROMETHAMINE 30 MG/ML IJ SOLN
30.0000 mg | Freq: Four times a day (QID) | INTRAMUSCULAR | Status: DC
  Administered 2017-01-24 – 2017-01-25 (×5): 30 mg via INTRAVENOUS
  Filled 2017-01-24 (×5): qty 1

## 2017-01-24 NOTE — Progress Notes (Signed)
01/24/2017  Subjective: Patient is 2 Days Post-Op s/p laparoscopic appendectomy.  No acute events overnight.  Yesterday had complaints of chest pain / gas pain.  EKG was normal, and pain resolved with GI cocktail.  Tolerated clears well.  Vital signs: Temp:  [98 F (36.7 C)-99.3 F (37.4 C)] 98 F (36.7 C) (09/08 0516) Pulse Rate:  [65-85] 67 (09/08 0516) Resp:  [16-20] 18 (09/08 0516) BP: (111-135)/(60-77) 127/64 (09/08 0516) SpO2:  [100 %] 100 % (09/08 0516)   Intake/Output: 09/07 0701 - 09/08 0700 In: 2796 [P.O.:720; I.V.:1957; IV Piggyback:119] Out: 1120 [Urine:1100; Drains:20] Last BM Date: 01/22/17  Physical Exam: Constitutional:  No acute distress Abdomen:  Soft, nondistended, appropriately tender to palpation.  Incisions clean, dry, intact with dressings in place.  JP drain with serosanguinous fluid.  Labs:   Recent Labs  01/23/17 0429 01/24/17 0424  WBC 15.6* 13.0*  HGB 13.9 13.2  HCT 40.2 37.3*  PLT 140* 136*    Recent Labs  01/23/17 0429 01/24/17 0424  NA 136 136  K 4.0 3.8  CL 103 103  CO2 26 26  GLUCOSE 187* 117*  BUN 9 8  CREATININE 0.98 0.85  CALCIUM 8.8* 8.8*   No results for input(s): LABPROT, INR in the last 72 hours.  Imaging: No results found.  Assessment/Plan: 26 yo male s/p laparoscopic appendectomy.  --WBC still elevated but improving.  Will continue IV Zosyn today and likely transition to oral tonight. --Advance to full liquid diet today and possibly regular diet tonight. --Add toradol for pain control. --OOB, ambulate. --anticipate discharge tomorrow if WBC improved.   Sean IllJose Luis Jevan Gaunt, MD [facility]

## 2017-01-25 LAB — CBC WITH DIFFERENTIAL/PLATELET
BASOS ABS: 0 10*3/uL (ref 0–0.1)
BASOS PCT: 0 %
Eosinophils Absolute: 0 10*3/uL (ref 0–0.7)
Eosinophils Relative: 0 %
HCT: 38.5 % — ABNORMAL LOW (ref 40.0–52.0)
HEMOGLOBIN: 13.3 g/dL (ref 13.0–18.0)
LYMPHS PCT: 10 %
Lymphs Abs: 0.9 10*3/uL — ABNORMAL LOW (ref 1.0–3.6)
MCH: 32.3 pg (ref 26.0–34.0)
MCHC: 34.5 g/dL (ref 32.0–36.0)
MCV: 93.7 fL (ref 80.0–100.0)
Monocytes Absolute: 0.5 10*3/uL (ref 0.2–1.0)
Monocytes Relative: 6 %
NEUTROS ABS: 7.1 10*3/uL — AB (ref 1.4–6.5)
NEUTROS PCT: 84 %
Platelets: 138 10*3/uL — ABNORMAL LOW (ref 150–440)
RBC: 4.11 MIL/uL — AB (ref 4.40–5.90)
RDW: 12.6 % (ref 11.5–14.5)
WBC: 8.5 10*3/uL (ref 3.8–10.6)

## 2017-01-25 MED ORDER — OXYCODONE HCL 5 MG PO TABS: 5.0000 mg | ORAL_TABLET | ORAL | 0 refills | Status: DC | PRN

## 2017-01-25 MED ORDER — IBUPROFEN 600 MG PO TABS: 600.0000 mg | ORAL_TABLET | Freq: Three times a day (TID) | ORAL | 0 refills | Status: DC | PRN

## 2017-01-25 MED ORDER — AMOXICILLIN-POT CLAVULANATE 875-125 MG PO TABS: 1.0000 | ORAL_TABLET | Freq: Two times a day (BID) | ORAL | 0 refills | Status: AC

## 2017-01-25 NOTE — Progress Notes (Signed)
Patient is being discharged home with a JP drain. Patient was educated on how to empty and care for his drain. Patient was also allowed to empty his drain as a return demonstration. Questions were encouraged and he denied having any.

## 2017-01-25 NOTE — Discharge Summary (Signed)
Patient ID: Sean Ayers MRN: [id number] DOB/AGE: 1990-12-02 26 y.o.  Admit date: 01/22/2017 Discharge date: 01/25/2017   Discharge Diagnoses:  Active Problems:   Acute appendicitis   Ruptured appendicitis   Procedures:  Laparoscopic appendectomy  Hospital Course:  Patient was admitted on 9/6 and taken to the operating room for a laparoscopic appendectomy.  JP drain was left in place.  Post-operatively, his diet was advanced slowly, his IV antibiotics were transitioned to oral as his WBC normalized.  He was ambulating, had his pain well controlled, and was deemed ready for discharge to home.  JP drain will remain in place and he will follow up with Dr. Tonita Cong next week for drain removal.  On exam, he was in no acute distress, with stable vital signs.  His abdomen was soft, nondistended, and appropriately tender to palpation.  Incisions were clean, dry, and intact.  JP drain with serosanguinous fluid.  Consults: None  Disposition:  Home, self care  Discharge Instructions    Call MD for:  difficulty breathing, headache or visual disturbances    Complete by:  As directed    Call MD for:  persistant nausea and vomiting    Complete by:  As directed    Call MD for:  redness, tenderness, or signs of infection (pain, swelling, redness, odor or green/yellow discharge around incision site)    Complete by:  As directed    Call MD for:  severe uncontrolled pain    Complete by:  As directed    Call MD for:  temperature >100.4    Complete by:  As directed    Change dressing (specify)    Complete by:  As directed    Change drain dressing daily and as needed.   Diet - low sodium heart healthy    Complete by:  As directed    Discharge instructions    Complete by:  As directed    1.  Patient may shower, but do not scrub wounds heavily and dab dry only. 2.  Do not submerge wounds in pool/tub 3.  Empty and record drain output daily and bring record with you to office appointment. 4.  Do not  remove steri strips and allow them to fall off on their own.   Driving Restrictions    Complete by:  As directed    Do not drive while taking narcotics for pain control.   Increase activity slowly    Complete by:  As directed    Lifting restrictions    Complete by:  As directed    No heavy lifting or pushing of more than 10-15 lbs for 4 weeks.     Allergies as of 01/25/2017   No Known Allergies     Medication List    TAKE these medications   amoxicillin-clavulanate 875-125 MG tablet Commonly known as:  AUGMENTIN Take 1 tablet by mouth every 12 (twelve) hours.   ibuprofen 600 MG tablet Commonly known as:  ADVIL,MOTRIN Take 1 tablet (600 mg total) by mouth every 8 (eight) hours as needed.   oxyCODONE 5 MG immediate release tablet Commonly known as:  Oxy IR/ROXICODONE Take 1-2 tablets (5-10 mg total) by mouth every 4 (four) hours as needed for moderate pain.            Discharge Care Instructions        Start     Ordered   01/25/17 0000  amoxicillin-clavulanate (AUGMENTIN) 875-125 MG tablet  Every 12 hours     01/25/17 0850  01/25/17 0000  oxyCODONE (OXY IR/ROXICODONE) 5 MG immediate release tablet  Every 4 hours PRN     01/25/17 0850   01/25/17 0000  Diet - low sodium heart healthy     01/25/17 0850   01/25/17 0000  Increase activity slowly     01/25/17 0850   01/25/17 0000  Driving Restrictions    Comments:  Do not drive while taking narcotics for pain control.   01/25/17 0850   01/25/17 0000  Lifting restrictions    Comments:  No heavy lifting or pushing of more than 10-15 lbs for 4 weeks.   01/25/17 0850   01/25/17 0000  Call MD for:  temperature >100.4     01/25/17 0850   01/25/17 0000  Call MD for:  persistant nausea and vomiting     01/25/17 0850   01/25/17 0000  Call MD for:  severe uncontrolled pain     01/25/17 0850   01/25/17 0000  Call MD for:  redness, tenderness, or signs of infection (pain, swelling, redness, odor or green/yellow discharge around  incision site)     01/25/17 0850   01/25/17 0000  Call MD for:  difficulty breathing, headache or visual disturbances     01/25/17 0850   01/25/17 0000  ibuprofen (ADVIL,MOTRIN) 600 MG tablet  Every 8 hours PRN     01/25/17 0850   01/25/17 0000  Discharge instructions    Comments:  1.  Patient may shower, but do not scrub wounds heavily and dab dry only. 2.  Do not submerge wounds in pool/tub 3.  Empty and record drain output daily and bring record with you to office appointment. 4.  Do not remove steri strips and allow them to fall off on their own.   01/25/17 0850   01/25/17 0000  Change dressing (specify)    Comments:  Change drain dressing daily and as needed.   01/25/17 [id number]     Follow-up Information    Ricarda FrameWoodham, Charles, MD Follow up on 01/29/2017.   Specialty:  General Surgery Why:  Follow up with Dr. Tonita CongWoodham on 9/13 at 10 am.  Please arrive by 9:45 am. Contact information: [address] [place] [address] [phone]

## 2017-01-26 ENCOUNTER — Telehealth: Payer: Self-pay

## 2017-01-26 LAB — SURGICAL PATHOLOGY

## 2017-01-26 NOTE — Telephone Encounter (Signed)
Post-op call made to patient at this time. Spoke with Sean Ayers. Post-op interview questions below.  1. How are you feeling? good  2. Is your pain controlled? yes  3. What are you doing for the pain? Pain medicine  4. Are you having any Nausea or Vomiting? No   5. Are you having any Fever or Chills? Chills, but no fever  6. Are you having any Constipation or Diarrhea? constipation  7. Is there any Swelling or Bruising you are concerned about? No   8. Do you have any questions or concerns at this time? no  Discussion: Patient has not had bowel movement since Thursday. He was instructed to take Miralax daily and increase water to 72 ounces daily. Post op appointment discussed 01/29/17 with dr.Woodham.

## 2017-01-29 ENCOUNTER — Encounter: Payer: Self-pay | Admitting: General Surgery

## 2017-01-29 ENCOUNTER — Ambulatory Visit (INDEPENDENT_AMBULATORY_CARE_PROVIDER_SITE_OTHER): Payer: Self-pay | Admitting: General Surgery

## 2017-01-29 VITALS — BP 110/68 | HR 82 | Temp 97.5°F | Wt 160.0 lb

## 2017-01-29 DIAGNOSIS — Z4889 Encounter for other specified surgical aftercare: Secondary | ICD-10-CM

## 2017-01-29 MED ORDER — OXYCODONE HCL 5 MG PO TABS: 5.0000 mg | ORAL_TABLET | Freq: Four times a day (QID) | ORAL | 0 refills | Status: DC | PRN

## 2017-01-29 NOTE — Progress Notes (Signed)
Outpatient Surgical Follow Up  01/29/2017  Sean Ayers is an 26 y.o. male.   Chief Complaint  Patient presents with  . Routine Post Op     Laparoscopic Appendectomy (01/22/17)- [facility](D[facility])    HPI: 26 year old male returns to clinic for follow up from a laparoscopic appendectomy. Drain has been decreasing in amount and has been clear liquid.He is eating well and having normal bowel function. Denies any fevers, chills, nausea, vomiting. He is eating well and having normal bowel function.  Past Medical History:  Diagnosis Date  . Acute appendicitis     Past Surgical History:  Procedure Laterality Date  . LAPAROSCOPIC APPENDECTOMY N/A 01/22/2017   Procedure: APPENDECTOMY LAPAROSCOPIC;  Surgeon: Ricarda FrameWoodham, Tashonna Descoteaux, MD;  Location: [facility] ORS;  Service: General;  Laterality: N/A;    Family History  Problem Relation Age of Onset  . Healthy Mother   . Healthy Father   . Brain cancer Other     Social History:  reports that he has been smoking Cigarettes.  He has never used smokeless tobacco. He reports that he does not drink alcohol or use drugs.  Allergies: No Known Allergies  Medications reviewed.    ROS A multipoint ROS was completed, all pertinent findings are in the HPI, the remainder are negative.   BP 110/68   Pulse 82   Temp (!) 97.5 F (36.4 C) (Oral)   Wt 72.6 kg (160 lb)   BMI 23.63 kg/m   Physical Exam Gen: NAD Resp: CTA CV: RRR Abd; Soft, ND. Appropriately tender at his incision sites that are all well approximated without evidence of infection or drainage. JP in place with serous fluid.    No results found for this or any previous visit (from the past 48 hour(s)). No results found.  Assessment/Plan:  1. Aftercare following surgery 26 year old male s/p lap appy. Drain removed without difficulty.Pathology reviewed. Discussed anticipated recovery. All questions answered. He will follow up PRN.     Ricarda Frameharles Yakima Kreitzer, MD FACS General  Surgeon  01/29/2017,12:48 PM

## 2017-01-29 NOTE — Patient Instructions (Addendum)
Finish your Antibiotics.  Please have your Prescription filled and take if needed for SEVERE Pain.  Please see your letter to return to work. Please call if you have any questions or concerns.    GENERAL POST-OPERATIVE PATIENT INSTRUCTIONS   WOUND CARE INSTRUCTIONS:  Keep a dry clean dressing on the wound if there is drainage. The initial bandage may be removed after 24 hours.  Once the wound has quit draining you may leave it open to air.  If clothing rubs against the wound or causes irritation and the wound is not draining you may cover it with a dry dressing during the daytime.  Try to keep the wound dry and avoid ointments on the wound unless directed to do so.  If the wound becomes bright red and painful or starts to drain infected material that is not clear, please contact your physician immediately.  If the wound is mildly pink and has a thick firm ridge underneath it, this is normal, and is referred to as a healing ridge.  This will resolve over the next 4-6 weeks.  BATHING: You may shower if you have been informed of this by your surgeon. However, Please do not submerge in a tub, hot tub, or pool until incisions are completely sealed or have been told by your surgeon that you may do so.  DIET:  You may eat any foods that you can tolerate.  It is a good idea to eat a high fiber diet and take in plenty of fluids to prevent constipation.  If you do become constipated you may want to take a mild laxative or take ducolax tablets on a daily basis until your bowel habits are regular.  Constipation can be very uncomfortable, along with straining, after recent surgery.  ACTIVITY:  You are encouraged to cough and deep breath or use your incentive spirometer if you were given one, every 15-30 minutes when awake.  This will help prevent respiratory complications and low grade fevers post-operatively if you had a general anesthetic.  You may want to hug a pillow when coughing and sneezing to add  additional support to the surgical area, if you had abdominal or chest surgery, which will decrease pain during these times.  You are encouraged to walk and engage in light activity for the next two weeks.  You should not lift more than 20 pounds, until 02/03/2017 as it could put you at increased risk for complications.  Twenty pounds is roughly equivalent to a plastic bag of groceries. At that time- Listen to your body when lifting, if you have pain when lifting, stop and then try again in a few days. Soreness after doing exercises or activities of daily living is normal as you get back in to your normal routine.  MEDICATIONS:  Try to take narcotic medications and anti-inflammatory medications, such as tylenol, ibuprofen, naprosyn, etc., with food.  This will minimize stomach upset from the medication.  Should you develop nausea and vomiting from the pain medication, or develop a rash, please discontinue the medication and contact your physician.  You should not drive, make important decisions, or operate machinery when taking narcotic pain medication.  SUNBLOCK Use sun block to incision area over the next year if this area will be exposed to sun. This helps decrease scarring and will allow you avoid a permanent darkened area over your incision.  QUESTIONS:  Please feel free to call our office if you have any questions, and we will be glad to assist  you. [phone]

## 2017-08-03 ENCOUNTER — Encounter: Payer: Self-pay | Admitting: Emergency Medicine

## 2017-08-03 ENCOUNTER — Emergency Department
Admission: EM | Admit: 2017-08-03 | Discharge: 2017-08-03 | Disposition: A | Discharge status: Home / Self Care | Payer: Self-pay | Attending: Emergency Medicine | Admitting: Emergency Medicine

## 2017-08-03 ENCOUNTER — Other Ambulatory Visit: Payer: Self-pay

## 2017-08-03 DIAGNOSIS — K0889 Other specified disorders of teeth and supporting structures: Secondary | ICD-10-CM | POA: Insufficient documentation

## 2017-08-03 DIAGNOSIS — K047 Periapical abscess without sinus: Secondary | ICD-10-CM

## 2017-08-03 DIAGNOSIS — F17210 Nicotine dependence, cigarettes, uncomplicated: Secondary | ICD-10-CM | POA: Insufficient documentation

## 2017-08-03 MED ORDER — MAGIC MOUTHWASH W/LIDOCAINE: 5.0000 mL | Freq: Four times a day (QID) | ORAL | 0 refills | Status: DC

## 2017-08-03 MED ORDER — IBUPROFEN 800 MG PO TABS: 800.0000 mg | ORAL_TABLET | Freq: Three times a day (TID) | ORAL | 0 refills | Status: DC | PRN

## 2017-08-03 MED ORDER — AMOXICILLIN 875 MG PO TABS: 875.0000 mg | ORAL_TABLET | Freq: Two times a day (BID) | ORAL | 0 refills | Status: DC

## 2017-08-03 NOTE — ED Triage Notes (Signed)
R dental pain x 2 days.

## 2017-08-03 NOTE — Discharge Instructions (Signed)
OPTIONS FOR DENTAL FOLLOW UP CARE ° °[place] [facility] - Local Safety Net Dental Clinics °http://www.ncdhhs.gov/dph/oralhealth/services/safetynetclinics.htm °  °[facility] (336-562-3123) ° °[facility] (919-933-9087) ° °[facility] (919-663-1744 ext [phone]) ° °[facility] (336-570-6415) ° °[facility] (919-968-2025) °This clinic caters to the indigent population and is on a lottery system. °Location: °[facility], [address], [place] °Clinic Hours: °Wednesdays from 6pm - 9pm, patients seen by a lottery system. °For dates, call or go to www.med.[facility].edu/[facility]/patients/Dental-[facility] °Services: °Cleanings, fillings and simple extractions. °Payment Options: °DENTAL WORK IS FREE OF CHARGE. Bring proof of income or support. °Best way to get seen: °Arrive at 5:15 pm - this is a lottery, NOT first come/first serve, so arriving earlier will not increase your chances of being seen. °  °  °[facility] °919-537-3737 °Select option 1 for emergencies °  °Location: °[facility], [address], [place] °Clinic Hours: °No walk-ins accepted - call the day before to schedule an appointment. °Check in times are 9:30 am and 1:30 pm. °Services: °Simple extractions, temporary fillings, pulpectomy/pulp debridement, uncomplicated abscess drainage. °Payment Options: °PAYMENT IS DUE AT THE TIME OF SERVICE.  Fee is usually $100-200, additional surgical procedures (e.g. abscess drainage) may be extra. °Cash, checks, Visa/MasterCard accepted.  Can file Medicaid if patient is covered for dental - patient should call case worker to check. °No discount for [facility] patients. °Best way to get seen: °MUST call the day before and get onto the schedule. Can usually be seen the next 1-2 days. No walk-ins accepted. °  °  °[facility] °[phone] °   °Location: °[facility], [address], [place] °Clinic Hours: °M, W, Th, F 8am or 1:30pm, Tues 9a or 1:30 - first come/first served. °Services: °Simple extractions, temporary fillings, uncomplicated abscess drainage.  You do not need to be an [place] resident. °Payment Options: °PAYMENT IS DUE AT THE TIME OF SERVICE. °Dental insurance, otherwise sliding scale - bring proof of income or support. °Depending on income and treatment needed, cost is usually $50-200. °Best way to get seen: °Arrive early as it is first come/first served. °  °  °[facility] °919-542-1641 °  °Location: °[address] °Clinic Hours: °Mon-Thu 8a-5p °Services: °Most basic dental services including extractions and fillings. °Payment Options: °PAYMENT IS DUE AT THE TIME OF SERVICE. °Sliding scale, up to 50% off - bring proof if income or support. °Medicaid with dental option accepted. °Best way to get seen: °Call to schedule an appointment, can usually be seen within 2 weeks OR they will try to see walk-ins - show up at 8a or 2p (you may have to wait). °  °  °[facility] °919-245-2435 °[place] RESIDENTS ONLY °  °Location: °[facility], [address], [place], [place] [address] °Clinic Hours: By appointment only. °Monday - Thursday 8am-5pm, Friday 8am-12pm °Services: Cleanings, fillings, extractions. °Payment Options: °PAYMENT IS DUE AT THE TIME OF SERVICE. °Cash, Visa or MasterCard. Sliding scale - $30 minimum per service. °Best way to get seen: °Come in to office, complete packet and make an appointment - need proof of income °or support monies for each household member and proof of [place] residence. °Usually takes about a month to get in. °  °  °[facility] °[phone] °  °Location: °[address]   [place] °Clinic Hours: Walk-in Urgent Care Dental Services are offered Monday-Friday  mornings only. °The numbers of emergencies accepted daily is limited to the number of °providers available. °Maximum 15 - Mondays, Wednesdays & Thursdays °Maximum 10 - Tuesdays & Fridays °Services: °You do not need to be a [place] resident to be seen for a dental emergency. °Emergencies are defined as pain, swelling, abnormal bleeding, or dental trauma. Walkins will receive x-rays if needed. °NOTE: Dental cleaning is not an emergency. °Payment Options: °PAYMENT IS DUE AT THE TIME OF SERVICE. °Minimum co-pay is $40.00 for uninsured patients. °Minimum co-pay is $3.00 for Medicaid with dental coverage. °Dental Insurance is accepted and must be presented at time of visit. °Medicare does not cover dental. °Forms of payment: Cash, credit card, checks. °Best way to get seen: °If not previously registered with the clinic, walk-in dental registration begins at 7:15 am and is on a first come/first serve basis. °If previously registered with the clinic, call to make an appointment. °  °  °[facility] °919-776-4359 °[place] RESIDENTS ONLY °  °Location: °[address], [place], [place] °Clinic Hours: °Mon-Thu 10a-2p °Services: Extractions only! °Payment Options: °FREE (donations accepted) - bring proof of income or support °Best way to get seen: °Call and schedule an appointment OR come at 8am on the 1st Monday of every month (except for holidays) when it is first come/first served. °  °  °[facility] °[phone] °  °Location: °[address], [place] °Clinic Hours: °Friday mornings °Services, Payment Options, Best way to get seen: °Call for info °

## 2017-08-03 NOTE — ED Provider Notes (Signed)
[facility] Emergency Department Provider Note  ____________________________________________  Time seen: Approximately 6:47 PM  I have reviewed the triage vital signs and the nursing notes.   HISTORY  Chief Complaint Dental Pain    HPI Sean Ayers is a 27 y.o. male since the emergency department complaining of right lower dental pain.  Patient reports that he first noticed his dental pain 2 days ago.  He is gargle with salt water solution which helped somewhat but he has had increased pain and swelling to the l right lower dentition.  No difficulty breathing or swallowing.  No fevers or chills.  Patient reports that he is new to the area and has not established with a dentist.  He reports that he does have poor dentition with multiple erosions to the gumline.  Patient denies any other injury or complaint.  Other than salt water gargles, no medications for this complaint prior to arrival.  Past Medical History:  Diagnosis Date  . Acute appendicitis     Patient Active Problem List   Diagnosis Date Noted  . Ruptured appendicitis 01/22/2017  . Acute appendicitis     Past Surgical History:  Procedure Laterality Date  . LAPAROSCOPIC APPENDECTOMY N/A 01/22/2017   Procedure: APPENDECTOMY LAPAROSCOPIC;  Surgeon: Ricarda Frame, MD;  Location: [facility] ORS;  Service: General;  Laterality: N/A;    Prior to Admission medications   Medication Sig Start Date End Date Taking? Authorizing Provider  amoxicillin (AMOXIL) 875 MG tablet Take 1 tablet (875 mg total) by mouth 2 (two) times daily. 08/03/17   Marcee Jacobs, Delorise Royals, PA-C  ibuprofen (ADVIL,MOTRIN) 800 MG tablet Take 1 tablet (800 mg total) by mouth every 8 (eight) hours as needed. 08/03/17   Affie Gasner, Delorise Royals, PA-C  magic mouthwash w/lidocaine SOLN Take 5 mLs by mouth 4 (four) times daily. 08/03/17   Jermaine Tholl, Delorise Royals, PA-C  oxyCODONE (OXY IR/ROXICODONE) 5 MG immediate release tablet Take 1 tablet (5 mg  total) by mouth every 6 (six) hours as needed for moderate pain. 01/29/17   Ricarda Frame, MD    Allergies Patient has no known allergies.  Family History  Problem Relation Age of Onset  . Healthy Mother   . Healthy Father   . Brain cancer Other     Social History Social History   Tobacco Use  . Smoking status: Current Every Day Smoker    Packs/day: 0.10    Types: Cigarettes  . Smokeless tobacco: Never Used  Substance Use Topics  . Alcohol use: No  . Drug use: No     Review of Systems  Constitutional: No fever/chills Eyes: No visual changes. No discharge ENT: Positive for right lower dental pain and swelling Cardiovascular: no chest pain. Respiratory: no cough. No SOB. Gastrointestinal: No abdominal pain.  No nausea, no vomiting.  Musculoskeletal: Negative for musculoskeletal pain. Skin: Negative for rash, abrasions, lacerations, ecchymosis. Neurological: Negative for headaches, focal weakness or numbness. 10-point ROS otherwise negative.  ____________________________________________   PHYSICAL EXAM:  VITAL SIGNS: ED Triage Vitals  Enc Vitals Group     BP 08/03/17 1803 (!) 155/81     Pulse Rate 08/03/17 1803 87     Resp 08/03/17 1803 20     Temp 08/03/17 1803 98.3 F (36.8 C)     Temp Source 08/03/17 1803 Oral     SpO2 08/03/17 1803 100 %     Weight 08/03/17 1804 190 lb (86.2 kg)     Height 08/03/17 1804 5\' 11"  (1.803 m)  Head Circumference --      Peak Flow --      Pain Score 08/03/17 1804 9     Pain Loc --      Pain Edu? --      Excl. in GC? --      Constitutional: Alert and oriented. Well appearing and in no acute distress. Eyes: Conjunctivae are normal. PERRL. EOMI. Head: Atraumatic. ENT:      Ears:       Nose: No congestion/rhinnorhea.      Mouth/Throat: Mucous membranes are moist.  Poor dentition throughout.  Multiple teeth eroded into the gumline.  Patient has erythema and edema surrounding the first, second, third molars right lower  dentition.  These teeth have eroded to the gumline.  No significant abscess is appreciated on visualization.  No external edema is appreciated.  Uvula is midline.  No evidence of deep space infection. Neck: No stridor.  Neck is supple full range of motion Hematological/Lymphatic/Immunilogical: No cervical lymphadenopathy. Cardiovascular: Normal rate, regular rhythm. Normal S1 and S2.  Good peripheral circulation. Respiratory: Normal respiratory effort without tachypnea or retractions. Lungs CTAB. Good air entry to the bases with no decreased or absent breath sounds. Musculoskeletal: Full range of motion to all extremities. No gross deformities appreciated. Neurologic:  Normal speech and language. No gross focal neurologic deficits are appreciated.  Skin:  Skin is warm, dry and intact. No rash noted. Psychiatric: Mood and affect are normal. Speech and behavior are normal. Patient exhibits appropriate insight and judgement.   ____________________________________________   LABS (all labs ordered are listed, but only abnormal results are displayed)  Labs Reviewed - No data to display ____________________________________________  EKG   ____________________________________________  RADIOLOGY  No results found.  ____________________________________________    PROCEDURES  Procedure(s) performed:    Procedures    Medications - No data to display   ____________________________________________   INITIAL IMPRESSION / ASSESSMENT AND PLAN / ED COURSE  Pertinent labs & imaging results that were available during my care of the patient were reviewed by me and considered in my medical decision making (see chart for details).  Review of the [place] CSRS was performed in accordance of the [facility] prior to dispensing any controlled drugs.     Patient's diagnosis is consistent with dental infection to the right lower dentition.  No indication of abscess requiring incision and drainage or  imaging.  No indication of deep space infection.  Patient will be treated with antibiotics, Magic mouthwash, ibuprofen.  Patient is given information on dental follow-up.  No indication for labs or imaging at this time.  Patient will follow up with dentist and/or primary care as needed.  Patient is given ED precautions to return to the ED for any worsening or new symptoms.     ____________________________________________  FINAL CLINICAL IMPRESSION(S) / ED DIAGNOSES  Final diagnoses:  Dental abscess      NEW MEDICATIONS STARTED DURING THIS VISIT:  ED Discharge Orders        Ordered    amoxicillin (AMOXIL) 875 MG tablet  2 times daily     08/03/17 1849    magic mouthwash w/lidocaine SOLN  4 times daily    Comments:  Dispense in a 1/1/1 ratio. Use lidocaine, diphenhydramine, prednisolone   08/03/17 1849    ibuprofen (ADVIL,MOTRIN) 800 MG tablet  Every 8 hours PRN     08/03/17 1849          This chart was dictated using voice recognition software/Dragon. Despite best  efforts to proofread, errors can occur which can change the meaning. Any change was purely unintentional.    Racheal Patches, PA-C 08/03/17 Carlis Stable    Minna Antis, MD 08/03/17 2312

## 2017-08-03 NOTE — ED Notes (Signed)
See triage note  Presents with dental pain /possible abscess

## 2018-04-22 ENCOUNTER — Emergency Department
Admission: EM | Admit: 2018-04-22 | Discharge: 2018-04-22 | Disposition: A | Discharge status: Home / Self Care | Payer: Self-pay | Attending: Emergency Medicine | Admitting: Emergency Medicine

## 2018-04-22 ENCOUNTER — Emergency Department: Payer: Self-pay

## 2018-04-22 ENCOUNTER — Encounter: Payer: Self-pay | Admitting: Emergency Medicine

## 2018-04-22 DIAGNOSIS — Y9289 Other specified places as the place of occurrence of the external cause: Secondary | ICD-10-CM | POA: Insufficient documentation

## 2018-04-22 DIAGNOSIS — F17210 Nicotine dependence, cigarettes, uncomplicated: Secondary | ICD-10-CM | POA: Insufficient documentation

## 2018-04-22 DIAGNOSIS — W108XXA Fall (on) (from) other stairs and steps, initial encounter: Secondary | ICD-10-CM | POA: Insufficient documentation

## 2018-04-22 DIAGNOSIS — Y998 Other external cause status: Secondary | ICD-10-CM | POA: Insufficient documentation

## 2018-04-22 DIAGNOSIS — S82891A Other fracture of right lower leg, initial encounter for closed fracture: Secondary | ICD-10-CM | POA: Insufficient documentation

## 2018-04-22 DIAGNOSIS — Y9302 Activity, running: Secondary | ICD-10-CM | POA: Insufficient documentation

## 2018-04-22 DIAGNOSIS — S8252XA Displaced fracture of medial malleolus of left tibia, initial encounter for closed fracture: Secondary | ICD-10-CM | POA: Insufficient documentation

## 2018-04-22 DIAGNOSIS — S8251XA Displaced fracture of medial malleolus of right tibia, initial encounter for closed fracture: Secondary | ICD-10-CM

## 2018-04-22 HISTORY — DX: Displaced fracture of medial malleolus of right tibia, initial encounter for closed fracture: S82.51XA

## 2018-04-22 MED ORDER — IBUPROFEN 600 MG PO TABS: 600.0000 mg | ORAL_TABLET | Freq: Four times a day (QID) | ORAL | 0 refills | Status: DC | PRN

## 2018-04-22 MED ORDER — OXYCODONE-ACETAMINOPHEN 5-325 MG PO TABS: 1.0000 | ORAL_TABLET | Freq: Four times a day (QID) | ORAL | 0 refills | Status: AC | PRN

## 2018-04-22 MED ORDER — OXYCODONE-ACETAMINOPHEN 5-325 MG PO TABS
1.0000 | ORAL_TABLET | Freq: Once | ORAL | Status: AC
  Administered 2018-04-22: 1 via ORAL
  Filled 2018-04-22: qty 1

## 2018-04-22 NOTE — Discharge Instructions (Addendum)
Your x-ray shows 2 fractures to the inside of your ankle.  Please keep splint on and keep weight off ankle using crutches until seen by orthopedics.  Please call orthopedics this afternoon for an appointment early next week.  Ice ankle today.  You can take Percocet for severe pain.

## 2018-04-22 NOTE — ED Notes (Signed)
See triage note  Presents s/p fall  States he fell last pm  Twisted right ankle  Positive swelling noted good pulses  Presents with boot in place

## 2018-04-22 NOTE — ED Provider Notes (Signed)
[facility] Emergency Department Provider Note  ____________________________________________  Time seen: Approximately 1:57 PM  I have reviewed the triage vital signs and the nursing notes.   HISTORY  Chief Complaint Ankle Pain    HPI Sean Ayers is a 27 y.o. male that presents to the emergency department for evaluation of right ankle pain after falling last night.  Patient states that he was running down the stairs and fell on the last step.  He twisted his right ankle.  Pain is primarily over the inside of his ankle but he also has some pain to the outside of his ankle.  It has been painful to walk on and he has been using a boot to ambulate that he got from his grandmother.  No additional injuries.   Past Medical History:  Diagnosis Date  . Acute appendicitis     Patient Active Problem List   Diagnosis Date Noted  . Ruptured appendicitis 01/22/2017  . Acute appendicitis     Past Surgical History:  Procedure Laterality Date  . LAPAROSCOPIC APPENDECTOMY N/A 01/22/2017   Procedure: APPENDECTOMY LAPAROSCOPIC;  Surgeon: Ricarda Frame, MD;  Location: [facility] ORS;  Service: General;  Laterality: N/A;    Prior to Admission medications   Medication Sig Start Date End Date Taking? Authorizing Provider  amoxicillin (AMOXIL) 875 MG tablet Take 1 tablet (875 mg total) by mouth 2 (two) times daily. 08/03/17   Cuthriell, Delorise Royals, PA-C  ibuprofen (ADVIL,MOTRIN) 600 MG tablet Take 1 tablet (600 mg total) by mouth every 6 (six) hours as needed. 04/22/18   Enid Derry, PA-C  magic mouthwash w/lidocaine SOLN Take 5 mLs by mouth 4 (four) times daily. 08/03/17   Cuthriell, Delorise Royals, PA-C  oxyCODONE (OXY IR/ROXICODONE) 5 MG immediate release tablet Take 1 tablet (5 mg total) by mouth every 6 (six) hours as needed for moderate pain. 01/29/17   Ricarda Frame, MD  oxyCODONE-acetaminophen (PERCOCET) 5-325 MG tablet Take 1 tablet by mouth every 6 (six) hours as needed  for up to 3 days for severe pain. 04/22/18 04/25/18  Enid Derry, PA-C    Allergies Patient has no known allergies.  Family History  Problem Relation Age of Onset  . Healthy Mother   . Healthy Father   . Brain cancer Other     Social History Social History   Tobacco Use  . Smoking status: Current Every Day Smoker    Packs/day: 0.10    Types: Cigarettes  . Smokeless tobacco: Never Used  Substance Use Topics  . Alcohol use: No  . Drug use: No     Review of Systems  Cardiovascular: No chest pain. Respiratory: No cough. No SOB. Gastrointestinal:  No nausea, no vomiting.  Musculoskeletal: Positive for ankle pain. Skin: Negative for rash, abrasions, lacerations, ecchymosis. Neurological: Negative for numbness or tingling   ____________________________________________   PHYSICAL EXAM:  VITAL SIGNS: ED Triage Vitals  Enc Vitals Group     BP 04/22/18 1230 121/75     Pulse Rate 04/22/18 1230 99     Resp 04/22/18 1230 12     Temp 04/22/18 1230 98.1 F (36.7 C)     Temp Source 04/22/18 1230 Oral     SpO2 04/22/18 1230 100 %     Weight 04/22/18 1224 170 lb (77.1 kg)     Height 04/22/18 1224 5\' 9"  (1.753 m)     Head Circumference --      Peak Flow --      Pain Score 04/22/18  1224 9     Pain Loc --      Pain Edu? --      Excl. in GC? --      Constitutional: Alert and oriented. Well appearing and in no acute distress. Eyes: Conjunctivae are normal. PERRL. EOMI. Head: Atraumatic. ENT:      Ears:      Nose: No congestion/rhinnorhea.      Mouth/Throat: Mucous membranes are moist.  Neck: No stridor.   Cardiovascular: Normal rate, regular rhythm.  Good peripheral circulation.  Cap refill less than 3 seconds. Respiratory: Normal respiratory effort without tachypnea or retractions. Lungs CTAB. Good air entry to the bases with no decreased or absent breath sounds. Musculoskeletal: Full range of motion to all extremities. No gross deformities appreciated.  Moderate  swelling over lateral and medial malleolus.  No ecchymosis.  Limited range of motion of ankle due to pain. Neurologic:  Normal speech and language. No gross focal neurologic deficits are appreciated.  Skin:  Skin is warm, dry and intact. No rash noted. Psychiatric: Mood and affect are normal. Speech and behavior are normal. Patient exhibits appropriate insight and judgement.   ____________________________________________   LABS (all labs ordered are listed, but only abnormal results are displayed)  Labs Reviewed - No data to display ____________________________________________  EKG   ____________________________________________  RADIOLOGY Lexine Baton, personally viewed and evaluated these images (plain radiographs) as part of my medical decision making, as well as reviewing the written report by the radiologist.  Dg Ankle 2 Views Right  Result Date: 04/22/2018 CLINICAL DATA:  Fall down steps yesterday with ankle pain, initial encounter EXAM: RIGHT ANKLE - 2 VIEW COMPARISON:  None. FINDINGS: There is well corticated bony density adjacent to the lateral malleolus likely related to prior trauma. A minimally displaced medial malleolar fracture is noted. No posterior or lateral malleolar fracture is noted. A small bony density is noted along the medial aspect of the talus consistent with a small avulsion fracture. The definitive donor site is not well appreciated on this exam. Generalized soft tissue swelling about the ankle is noted. IMPRESSION: Medial malleolar fracture with minimal displacement. A small bony density is also noted adjacent to the talus consistent with avulsion. The donor site is not discretely seen on this exam. Electronically Signed   By: Alcide Clever M.D.   On: 04/22/2018 13:14    ____________________________________________    PROCEDURES  Procedure(s) performed:    Procedures    Medications  oxyCODONE-acetaminophen (PERCOCET/ROXICET) 5-325 MG per tablet  1 tablet (1 tablet Oral Given 04/22/18 1417)     ____________________________________________   INITIAL IMPRESSION / ASSESSMENT AND PLAN / ED COURSE  Pertinent labs & imaging results that were available during my care of the patient were reviewed by me and considered in my medical decision making (see chart for details).  Review of the [place] CSRS was performed in accordance of the [facility] prior to dispensing any controlled drugs.     Patient's diagnosis is consistent with medial malleolus fracture and avulsion fracture of talus.  Vital signs and exam are reassuring.  X-ray consistent with minimally displaced medial malleolus fracture and avulsion fracture of talus.  Sugar tong and posterior splint were applied.  Crutches were given.  Patient will be discharged home with prescriptions for ibuprofen and a short course of Percocet. Patient is to follow up with orthopedics as directed. Patient is given ED precautions to return to the ED for any worsening or new symptoms.  ____________________________________________  FINAL CLINICAL IMPRESSION(S) / ED DIAGNOSES  Final diagnoses:  Displaced fracture of medial malleolus of left tibia, initial encounter for closed fracture  Closed avulsion fracture of right ankle, initial encounter      NEW MEDICATIONS STARTED DURING THIS VISIT:  ED Discharge Orders         Ordered    ibuprofen (ADVIL,MOTRIN) 600 MG tablet  Every 6 hours PRN     04/22/18 1432    oxyCODONE-acetaminophen (PERCOCET) 5-325 MG tablet  Every 6 hours PRN     04/22/18 1432              This chart was dictated using voice recognition software/Dragon. Despite best efforts to proofread, errors can occur which can change the meaning. Any change was purely unintentional.    Enid DerryWagner, Jameria Bradway, PA-C 04/22/18 1554    Governor RooksLord, Rebecca, MD 04/22/18 [phone]

## 2018-04-22 NOTE — ED Triage Notes (Signed)
Pt with brace on his right ankle states he got it from his grandmother.

## 2018-04-22 NOTE — ED Triage Notes (Signed)
Pt reports fell last night and thinks he sprained his right ankle. Pt reports painful to walk on.

## 2018-05-18 ENCOUNTER — Emergency Department
Admission: EM | Admit: 2018-05-18 | Discharge: 2018-05-18 | Disposition: A | Discharge status: Home / Self Care | Payer: Self-pay | Attending: Emergency Medicine | Admitting: Emergency Medicine

## 2018-05-18 ENCOUNTER — Other Ambulatory Visit: Payer: Self-pay

## 2018-05-18 ENCOUNTER — Encounter: Payer: Self-pay | Admitting: *Deleted

## 2018-05-18 ENCOUNTER — Emergency Department: Payer: Self-pay

## 2018-05-18 DIAGNOSIS — W19XXXA Unspecified fall, initial encounter: Secondary | ICD-10-CM | POA: Insufficient documentation

## 2018-05-18 DIAGNOSIS — F17210 Nicotine dependence, cigarettes, uncomplicated: Secondary | ICD-10-CM | POA: Insufficient documentation

## 2018-05-18 DIAGNOSIS — S20212A Contusion of left front wall of thorax, initial encounter: Secondary | ICD-10-CM | POA: Insufficient documentation

## 2018-05-18 DIAGNOSIS — Y939 Activity, unspecified: Secondary | ICD-10-CM | POA: Insufficient documentation

## 2018-05-18 DIAGNOSIS — Y999 Unspecified external cause status: Secondary | ICD-10-CM | POA: Insufficient documentation

## 2018-05-18 DIAGNOSIS — R0789 Other chest pain: Secondary | ICD-10-CM

## 2018-05-18 DIAGNOSIS — Y929 Unspecified place or not applicable: Secondary | ICD-10-CM | POA: Insufficient documentation

## 2018-05-18 HISTORY — DX: Displaced fracture of medial malleolus of right tibia, initial encounter for closed fracture: S82.51XA

## 2018-05-18 MED ORDER — LIDOCAINE 5 % EX PTCH: 1.0000 | MEDICATED_PATCH | Freq: Two times a day (BID) | CUTANEOUS | 0 refills | Status: AC

## 2018-05-18 MED ORDER — LIDOCAINE 5 % EX PTCH
1.0000 | MEDICATED_PATCH | CUTANEOUS | Status: DC
  Administered 2018-05-18: 1 via TRANSDERMAL
  Filled 2018-05-18: qty 1

## 2018-05-18 NOTE — ED Triage Notes (Signed)
Pt reports he fell approx 4 weeks ago against railings on stairs.  Pt is c/o of right anterior rib pain.  Pt broke his right foot and has a cast and crutches during the fall.  No sob.  Pt alert.

## 2018-05-18 NOTE — Discharge Instructions (Signed)

## 2018-05-18 NOTE — ED Provider Notes (Signed)
[facility] Emergency Department Provider Note  ____________________________________________   First MD Initiated Contact with Patient 05/18/18 [phone]     (approximate)  I have reviewed the triage vital signs and the nursing notes.   HISTORY  Chief Complaint Rib Injury    HPI Sean Ayers is a 27 y.o. male who presents for evaluation of persistent left sided anterior chest wall pain below his right nipple from a fall that he sustained about 3 to 4 weeks ago.  At the time he was most focused on the pain in his right ankle but he reports that he had some chest pain on the left side after the injury as well and it is persisted if not gotten worse over the last 3 to 4 weeks.  It is worse when he moves around, takes deep breaths, coughs, or strains.  It is also tender to palpation.  Nothing in particular makes it better.  He is not having difficulty breathing, just has pain when he takes deep inspirations.  He denies fever/chills, shortness of breath, nausea, vomiting, and abdominal pain.  Past Medical History:  Diagnosis Date  . Acute appendicitis   . Closed fracture of medial malleolus of right ankle 04/22/2018    Patient Active Problem List   Diagnosis Date Noted  . Ruptured appendicitis 01/22/2017  . Acute appendicitis     Past Surgical History:  Procedure Laterality Date  . LAPAROSCOPIC APPENDECTOMY N/A 01/22/2017   Procedure: APPENDECTOMY LAPAROSCOPIC;  Surgeon: Ricarda FrameWoodham, Charles, MD;  Location: [facility] ORS;  Service: General;  Laterality: N/A;    Prior to Admission medications   Medication Sig Start Date End Date Taking? Authorizing Provider  amoxicillin (AMOXIL) 875 MG tablet Take 1 tablet (875 mg total) by mouth 2 (two) times daily. 08/03/17   Cuthriell, Delorise RoyalsJonathan D, PA-C  ibuprofen (ADVIL,MOTRIN) 600 MG tablet Take 1 tablet (600 mg total) by mouth every 6 (six) hours as needed. 04/22/18   Enid DerryWagner, Ashley, PA-C  lidocaine (LIDODERM) 5 % Place 1 patch onto  the skin every 12 (twelve) hours. Remove & Discard patch within 12 hours or as directed by MD.  Wynelle FannyLeave the patch off for 12 hours before applying a new one. 05/18/18 05/18/19  Loleta RoseForbach, Litzi Binning, MD  magic mouthwash w/lidocaine SOLN Take 5 mLs by mouth 4 (four) times daily. 08/03/17   Cuthriell, Delorise RoyalsJonathan D, PA-C  oxyCODONE (OXY IR/ROXICODONE) 5 MG immediate release tablet Take 1 tablet (5 mg total) by mouth every 6 (six) hours as needed for moderate pain. 01/29/17   Ricarda FrameWoodham, Charles, MD    Allergies Patient has no known allergies.  Family History  Problem Relation Age of Onset  . Healthy Mother   . Healthy Father   . Brain cancer Other     Social History Social History   Tobacco Use  . Smoking status: Current Every Day Smoker    Packs/day: 0.10    Types: Cigarettes  . Smokeless tobacco: Never Used  Substance Use Topics  . Alcohol use: No  . Drug use: No    Review of Systems Constitutional: No fever/chills Cardiovascular: Anterior chest wall pain on the left side below the left nipple as described above. Respiratory: Denies shortness of breath.  Pain with deep inspiration. Gastrointestinal: No abdominal pain.  No nausea, no vomiting.  No diarrhea.  No constipation. musculoskeletal: Anterior chest wall pain on the left side below the left nipple as described above. Integumentary: Negative for rash. Neurological: Negative for headaches, focal weakness or numbness.  ____________________________________________   PHYSICAL EXAM:  VITAL SIGNS: ED Triage Vitals  Enc Vitals Group     BP 05/18/18 0248 121/76     Pulse Rate 05/18/18 0248 94     Resp 05/18/18 0248 15     Temp 05/18/18 0248 98.4 F (36.9 C)     Temp Source 05/18/18 0248 Oral     SpO2 05/18/18 0248 98 %     Weight 05/18/18 0248 79.4 kg (175 lb)     Height 05/18/18 0248 1.753 m (5\' 9" )     Head Circumference --      Peak Flow --      Pain Score 05/18/18 0253 8     Pain Loc --      Pain Edu? --      Excl. in GC?  --     Constitutional: Alert and oriented. Well appearing and in no acute distress. Eyes: Conjunctivae are normal.  Head: Atraumatic. Cardiovascular: Normal rate, regular rhythm. Good peripheral circulation. Respiratory: Normal respiratory effort.  No retractions. Lungs CTAB. Musculoskeletal: Highly reproducible left-sided chest wall tenderness to even light touch below his left nipple.  No palpable deformities, no evidence of erythema or vesicular lesions, no crepitus, no ecchymosis or contusion.  No lower extremity tenderness nor edema. No gross deformities of extremities. Neurologic:  Normal speech and language. No gross focal neurologic deficits are appreciated.  Skin:  Skin is warm, dry and intact. No rash noted. Psychiatric: Mood and affect are normal. Speech and behavior are normal.  ____________________________________________   LABS (all labs ordered are listed, but only abnormal results are displayed)  Labs Reviewed - No data to display ____________________________________________  EKG  No indication for EKG ____________________________________________  RADIOLOGY I, Loleta Roseory Drenda Sobecki, personally viewed and evaluated these images (plain radiographs) as part of my medical decision making, as well as reviewing the written report by the radiologist.  ED MD interpretation: No rib fractures  Official radiology report(s): Dg Chest 2 View  Result Date: 05/18/2018 CLINICAL DATA:  Fall with right rib pain EXAM: CHEST - 2 VIEW COMPARISON:  None. FINDINGS: The heart size and mediastinal contours are within normal limits. Both lungs are clear. The visualized skeletal structures are unremarkable. IMPRESSION: No active cardiopulmonary disease.  No rib fracture. Electronically Signed   By: Deatra RobinsonKevin  Herman M.D.   On: 05/18/2018 03:13    ____________________________________________   PROCEDURES  Critical Care performed: No   Procedure(s) performed:    Procedures   ____________________________________________   INITIAL IMPRESSION / ASSESSMENT AND PLAN / ED COURSE  As part of my medical decision making, I reviewed the following data within the electronic MEDICAL RECORD NUMBER Nursing notes reviewed and incorporated, Old chart reviewed, Radiograph reviewed  and Notes from prior ED visits    Highly reproducible left chest wall tenderness to palpation and with movement consistent with a rib contusion or musculoskeletal strain.  Not consistent with costochondritis or other infectious or inflammatory process.  Several rib fracture is possible but unlikely given that I would expect to see some new bone growth in the affected area on the chest x-ray by now.  I had my usual and customary rib contusions/chest wall tenderness discussion with the patient and provided a Lidoderm patch, encouraged NSAIDs and cold therapy, etc.  I gave my usual customary return precautions and he agreed with the plan.     ____________________________________________  FINAL CLINICAL IMPRESSION(S) / ED DIAGNOSES  Final diagnoses:  Contusion of rib on left side, initial encounter  Anterior  chest wall pain     MEDICATIONS GIVEN DURING THIS VISIT:  Medications  lidocaine (LIDODERM) 5 % 1 patch (1 patch Transdermal Patch Applied 05/18/18 0700)     ED Discharge Orders         Ordered    lidocaine (LIDODERM) 5 %  Every 12 hours     05/18/18 1610           Note:  This document was prepared using Dragon voice recognition software and may include unintentional dictation errors.    Loleta Rose, MD 05/18/18 [phone]

## 2019-06-27 ENCOUNTER — Emergency Department: Payer: Self-pay

## 2019-06-27 ENCOUNTER — Encounter: Payer: Self-pay | Admitting: Emergency Medicine

## 2019-06-27 ENCOUNTER — Emergency Department
Admission: EM | Admit: 2019-06-27 | Discharge: 2019-06-27 | Disposition: A | Discharge status: Home / Self Care | Payer: Self-pay | Attending: Emergency Medicine | Admitting: Emergency Medicine

## 2019-06-27 ENCOUNTER — Other Ambulatory Visit: Payer: Self-pay

## 2019-06-27 DIAGNOSIS — F17210 Nicotine dependence, cigarettes, uncomplicated: Secondary | ICD-10-CM | POA: Insufficient documentation

## 2019-06-27 DIAGNOSIS — B3781 Candidal esophagitis: Secondary | ICD-10-CM | POA: Insufficient documentation

## 2019-06-27 DIAGNOSIS — Z79899 Other long term (current) drug therapy: Secondary | ICD-10-CM | POA: Insufficient documentation

## 2019-06-27 DIAGNOSIS — Z21 Asymptomatic human immunodeficiency virus [HIV] infection status: Secondary | ICD-10-CM | POA: Insufficient documentation

## 2019-06-27 LAB — CBC
HCT: 36.5 % — ABNORMAL LOW (ref 39.0–52.0)
Hemoglobin: 12.2 g/dL — ABNORMAL LOW (ref 13.0–17.0)
MCH: 30.2 pg (ref 26.0–34.0)
MCHC: 33.4 g/dL (ref 30.0–36.0)
MCV: 90.3 fL (ref 80.0–100.0)
Platelets: 156 10*3/uL (ref 150–400)
RBC: 4.04 MIL/uL — ABNORMAL LOW (ref 4.22–5.81)
RDW: 12.5 % (ref 11.5–15.5)
WBC: 5.1 10*3/uL (ref 4.0–10.5)
nRBC: 0 % (ref 0.0–0.2)

## 2019-06-27 LAB — COMPREHENSIVE METABOLIC PANEL
ALT: 23 U/L (ref 0–44)
AST: 31 U/L (ref 15–41)
Albumin: 3.4 g/dL — ABNORMAL LOW (ref 3.5–5.0)
Alkaline Phosphatase: 85 U/L (ref 38–126)
Anion gap: 7 (ref 5–15)
BUN: 9 mg/dL (ref 6–20)
CO2: 26 mmol/L (ref 22–32)
Calcium: 8.9 mg/dL (ref 8.9–10.3)
Chloride: 102 mmol/L (ref 98–111)
Creatinine, Ser: 0.86 mg/dL (ref 0.61–1.24)
GFR calc Af Amer: >60 mL/min (ref >60)
GFR calc non Af Amer: >60 mL/min (ref >60)
Glucose, Bld: 97 mg/dL (ref 70–99)
Potassium: 4 mmol/L (ref 3.5–5.1)
Sodium: 135 mmol/L (ref 135–145)
Total Bilirubin: 0.7 mg/dL (ref 0.3–1.2)
Total Protein: 7.8 g/dL (ref 6.5–8.1)

## 2019-06-27 LAB — URINALYSIS, COMPLETE (UACMP) WITH MICROSCOPIC
Bilirubin Urine: NEGATIVE
Glucose, UA: NEGATIVE mg/dL
Hgb urine dipstick: NEGATIVE
Ketones, ur: NEGATIVE mg/dL
Leukocytes,Ua: NEGATIVE
Nitrite: NEGATIVE
Protein, ur: 30 mg/dL — AB
Specific Gravity, Urine: 1.025 (ref 1.005–1.030)
pH: 6 (ref 5.0–8.0)

## 2019-06-27 LAB — LIPASE, BLOOD: Lipase: 26 U/L (ref 11–51)

## 2019-06-27 MED ORDER — FLUCONAZOLE 100 MG PO TABS: 200.0000 mg | ORAL_TABLET | Freq: Every day | ORAL | 0 refills | Status: AC

## 2019-06-27 MED ORDER — LIDOCAINE VISCOUS HCL 2 % MT SOLN
15.0000 mL | Freq: Once | OROMUCOSAL | Status: AC
  Administered 2019-06-27: 15 mL via ORAL
  Filled 2019-06-27: qty 15

## 2019-06-27 MED ORDER — ALUM & MAG HYDROXIDE-SIMETH 200-200-20 MG/5ML PO SUSP
15.0000 mL | Freq: Once | ORAL | Status: AC
  Administered 2019-06-27: 15 mL via ORAL
  Filled 2019-06-27: qty 30

## 2019-06-27 MED ORDER — LACTATED RINGERS IV BOLUS
1000.0000 mL | Freq: Once | INTRAVENOUS | Status: AC
  Administered 2019-06-27: 1000 mL via INTRAVENOUS

## 2019-06-27 MED ORDER — FLUCONAZOLE 50 MG PO TABS
400.0000 mg | ORAL_TABLET | Freq: Once | ORAL | Status: AC
  Administered 2019-06-27: 400 mg via ORAL
  Filled 2019-06-27: qty 8

## 2019-06-27 MED ORDER — SODIUM CHLORIDE 0.9% FLUSH
3.0000 mL | Freq: Once | INTRAVENOUS | Status: AC
  Administered 2019-06-27: 3 mL via INTRAVENOUS

## 2019-06-27 MED ORDER — ONDANSETRON HCL 4 MG/2ML IJ SOLN
4.0000 mg | Freq: Once | INTRAMUSCULAR | Status: AC
  Administered 2019-06-27: 4 mg via INTRAVENOUS
  Filled 2019-06-27: qty 2

## 2019-06-27 NOTE — ED Provider Notes (Signed)
[facility] Emergency Department Provider Note   ____________________________________________   First MD Initiated Contact with Patient 06/27/19 1226     (approximate)  I have reviewed the triage vital signs and the nursing notes.   HISTORY  Chief Complaint Abdominal Pain    HPI Sean Ayers is a 29 y.o. male with past medical history of appendicitis status post appendectomy who presents to the ED complaining of abdominal pain.  Patient reports that he has had 3 months of pain starting in his upper abdomen and moving into his chest.  He describes it as a burning that seems to worsen when he is laying down at night.  When the discomfort is bad, he states it can make it hard for him to breathe but he currently denies any shortness of breath and he has not had any fevers or cough.  He has been taking Pepcid at home without significant relief.  He does admit to smoking cigarettes and prior to onset of pain he was drinking alcohol on a regular basis.  He states he has recently been able to do so as it makes the pain worse.  He has had occasional nausea and vomiting, states it has been difficult for him to keep liquids or solids down recently.  He denies significant NSAID use, has been taking Tylenol without relief.        Past Medical History:  Diagnosis Date  . Acute appendicitis   . Closed fracture of medial malleolus of right ankle 04/22/2018    Patient Active Problem List   Diagnosis Date Noted  . Ruptured appendicitis 01/22/2017  . Acute appendicitis     Past Surgical History:  Procedure Laterality Date  . LAPAROSCOPIC APPENDECTOMY N/A 01/22/2017   Procedure: APPENDECTOMY LAPAROSCOPIC;  Surgeon: Clayburn Pert, MD;  Location: [facility] ORS;  Service: General;  Laterality: N/A;    Prior to Admission medications   Medication Sig Start Date End Date Taking? Authorizing Provider  amoxicillin (AMOXIL) 875 MG tablet Take 1 tablet (875 mg total) by mouth 2  (two) times daily. 08/03/17   Cuthriell, Charline Bills, PA-C  fluconazole (DIFLUCAN) 100 MG tablet Take 2 tablets (200 mg total) by mouth daily for 14 days. 06/27/19 07/11/19  Blake Divine, MD  ibuprofen (ADVIL,MOTRIN) 600 MG tablet Take 1 tablet (600 mg total) by mouth every 6 (six) hours as needed. 04/22/18   Laban Emperor, PA-C  magic mouthwash w/lidocaine SOLN Take 5 mLs by mouth 4 (four) times daily. 08/03/17   Cuthriell, Charline Bills, PA-C  oxyCODONE (OXY IR/ROXICODONE) 5 MG immediate release tablet Take 1 tablet (5 mg total) by mouth every 6 (six) hours as needed for moderate pain. 01/29/17   Clayburn Pert, MD    Allergies Patient has no known allergies.  Family History  Problem Relation Age of Onset  . Healthy Mother   . Healthy Father   . Brain cancer Other     Social History Social History   Tobacco Use  . Smoking status: Current Every Day Smoker    Packs/day: 0.10    Types: Cigarettes  . Smokeless tobacco: Never Used  Substance Use Topics  . Alcohol use: No  . Drug use: No    Review of Systems  Constitutional: No fever/chills Eyes: No visual changes. ENT: No sore throat. Cardiovascular: Positive for chest pain. Respiratory: Positive for shortness of breath. Gastrointestinal: Positive for abdominal pain.  Positive for nausea and vomiting.  No diarrhea.  No constipation. Genitourinary: Negative for dysuria. Musculoskeletal:  Negative for back pain. Skin: Negative for rash. Neurological: Negative for headaches, focal weakness or numbness.  ____________________________________________   PHYSICAL EXAM:  VITAL SIGNS: ED Triage Vitals  Enc Vitals Group     BP 06/27/19 1202 115/66     Pulse Rate 06/27/19 1202 88     Resp 06/27/19 1202 20     Temp 06/27/19 1202 97.7 F (36.5 C)     Temp Source 06/27/19 1202 Oral     SpO2 06/27/19 1202 100 %     Weight 06/27/19 1157 175 lb 0.7 oz (79.4 kg)     Height 06/27/19 1203 5\' 11"  (1.803 m)     Head Circumference --       Peak Flow --      Pain Score 06/27/19 1157 0     Pain Loc --      Pain Edu? --      Excl. in GC? --     Constitutional: Alert and oriented. Eyes: Conjunctivae are normal. Head: Atraumatic. Nose: No congestion/rhinnorhea. Mouth/Throat: Mucous membranes are moist.  Diffuse whitish plaques noted in both anterior and posterior oropharynx. Neck: Normal ROM Cardiovascular: Normal rate, regular rhythm. Grossly normal heart sounds. Respiratory: Normal respiratory effort.  No retractions. Lungs CTAB. Gastrointestinal: Soft and tender to palpation in the epigastrium and left upper quadrant. No distention. Genitourinary: deferred Musculoskeletal: No lower extremity tenderness nor edema. Neurologic:  Normal speech and language. No gross focal neurologic deficits are appreciated. Skin:  Skin is warm, dry and intact. No rash noted. Psychiatric: Mood and affect are normal. Speech and behavior are normal.  ____________________________________________   LABS (all labs ordered are listed, but only abnormal results are displayed)  Labs Reviewed  COMPREHENSIVE METABOLIC PANEL - Abnormal; Notable for the following components:      Result Value   Albumin 3.4 (*)    All other components within normal limits  CBC - Abnormal; Notable for the following components:   RBC 4.04 (*)    Hemoglobin 12.2 (*)    HCT 36.5 (*)    All other components within normal limits  URINALYSIS, COMPLETE (UACMP) WITH MICROSCOPIC - Abnormal; Notable for the following components:   Color, Urine AMBER (*)    APPearance CLOUDY (*)    Protein, ur 30 (*)    Bacteria, UA RARE (*)    All other components within normal limits  LIPASE, BLOOD  HIV ANTIBODY (ROUTINE TESTING W REFLEX)   ____________________________________________  EKG  ED ECG REPORT I, 08/25/19, the attending physician, personally viewed and interpreted this ECG.   Date: 06/27/2019  EKG Time: 11:59  Rate: 81  Rhythm: normal sinus rhythm  Axis:  Normal  Intervals:none  ST&T Change: None  PROCEDURES  Procedure(s) performed (including Critical Care):  Procedures   ____________________________________________   INITIAL IMPRESSION / ASSESSMENT AND PLAN / ED COURSE       29 year old male presents to the ED complaining of 3 months of epigastrium and left upper quadrant discomfort that seems to move up into his chest and is particularly worse at night.  EKG without evidence of arrhythmia or ischemia, doubt cardiac etiology.  Symptoms sound typical for gastritis and reflux, would also consider peptic ulcer disease and we will treat with GI cocktail as well as Zofran.  Also plan to screen lab work for evidence of pancreatitis, hepatitis, or biliary obstruction.  If lab work is unremarkable and symptoms improved, will hold off on CT scan.  Symptoms slightly improved following GI cocktail, although patient's  findings of oral candidiasis are concerning for an esophageal candidiasis given his symptoms.  He was given an initial dose of fluconazole and we will perform testing for HIV.  Chest x-ray also shows possible developing infiltrates bilaterally, but would hold off on antibiotics for this given no fevers, cough, or shortness of breath.  HIV testing was pending at the time of discharge and counseled patient he would be notified of results, if positive will need to follow-up with infectious disease.  Patient counseled to return to the ED for new or worsening symptoms, patient agrees with plan.      ____________________________________________   FINAL CLINICAL IMPRESSION(S) / ED DIAGNOSES  Final diagnoses:  Esophageal candidiasis [facility])     ED Discharge Orders         Ordered    fluconazole (DIFLUCAN) 100 MG tablet  Daily     06/27/19 1409           Note:  This document was prepared using Dragon voice recognition software and may include unintentional dictation errors.   Chesley Noon, MD 06/27/19 [phone]

## 2019-06-27 NOTE — ED Triage Notes (Signed)
First Nurse Note: C/O heartburn, N/V/ SOB, inability to eat and inability to sleep x 3 months.  Patient is AAOx3.  Skin warm and dry. NAD

## 2019-06-28 LAB — HIV ANTIBODY (ROUTINE TESTING W REFLEX): HIV Screen 4th Generation wRfx: REACTIVE — AB

## 2019-06-29 LAB — HIV-1/2 AB - DIFFERENTIATION
HIV 1 Ab: POSITIVE — AB
HIV 2 Ab: NEGATIVE

## 2019-06-30 ENCOUNTER — Telehealth: Payer: Self-pay | Admitting: *Deleted

## 2019-06-30 ENCOUNTER — Telehealth: Payer: Self-pay | Admitting: Infectious Disease

## 2019-06-30 NOTE — Telephone Encounter (Signed)
Much thanks.

## 2019-06-30 NOTE — Telephone Encounter (Signed)
-----   Message from Cliffton Asters, MD sent at 06/29/2019  4:44 PM EST ----- In ED 06/27/19 with thrush.  No records in epic to indicate he is aware of the diagnosis for end of care.  Thanks.Sean Ayers

## 2019-06-30 NOTE — Telephone Encounter (Signed)
Ok thanks so much [place]!

## 2019-06-30 NOTE — Telephone Encounter (Signed)
Sent referral to [facility]. Per Knute Neu has been assigned to this case. Andree Coss, RN

## 2019-06-30 NOTE — Telephone Encounter (Signed)
RN alerted Barbara Cower at [employer], sent facesheet.  Denyse Amass ([facility]) has already been assigned to this patient. Andree Coss, RN

## 2019-06-30 NOTE — Telephone Encounter (Signed)
Patient w newly dx HIV, thrush needs to get into clinic asap  Is aware of test pending

## 2019-07-12 ENCOUNTER — Inpatient Hospital Stay
Admission: EM | Admit: 2019-07-12 | Discharge: 2019-07-16 | DRG: 974 | Disposition: A | Discharge status: Home / Self Care | Payer: Self-pay | Attending: Internal Medicine | Admitting: Internal Medicine

## 2019-07-12 ENCOUNTER — Emergency Department: Payer: Self-pay

## 2019-07-12 ENCOUNTER — Encounter: Payer: Self-pay | Admitting: Emergency Medicine

## 2019-07-12 ENCOUNTER — Other Ambulatory Visit: Payer: Self-pay

## 2019-07-12 DIAGNOSIS — R64 Cachexia: Secondary | ICD-10-CM | POA: Diagnosis present

## 2019-07-12 DIAGNOSIS — J9601 Acute respiratory failure with hypoxia: Secondary | ICD-10-CM | POA: Diagnosis present

## 2019-07-12 DIAGNOSIS — B20 Human immunodeficiency virus [HIV] disease: Principal | ICD-10-CM | POA: Diagnosis present

## 2019-07-12 DIAGNOSIS — A419 Sepsis, unspecified organism: Secondary | ICD-10-CM

## 2019-07-12 DIAGNOSIS — Z681 Body mass index (BMI) 19 or less, adult: Secondary | ICD-10-CM

## 2019-07-12 DIAGNOSIS — Z20822 Contact with and (suspected) exposure to covid-19: Secondary | ICD-10-CM | POA: Diagnosis present

## 2019-07-12 DIAGNOSIS — Z87891 Personal history of nicotine dependence: Secondary | ICD-10-CM

## 2019-07-12 DIAGNOSIS — B59 Pneumocystosis: Secondary | ICD-10-CM | POA: Diagnosis present

## 2019-07-12 DIAGNOSIS — J449 Chronic obstructive pulmonary disease, unspecified: Secondary | ICD-10-CM | POA: Diagnosis present

## 2019-07-12 DIAGNOSIS — E46 Unspecified protein-calorie malnutrition: Secondary | ICD-10-CM | POA: Diagnosis present

## 2019-07-12 DIAGNOSIS — D638 Anemia in other chronic diseases classified elsewhere: Secondary | ICD-10-CM | POA: Diagnosis present

## 2019-07-12 LAB — POC SARS CORONAVIRUS 2 AG: SARS Coronavirus 2 Ag: NEGATIVE

## 2019-07-12 LAB — CBC WITH DIFFERENTIAL/PLATELET
Abs Immature Granulocytes: 0.33 10*3/uL — ABNORMAL HIGH (ref 0.00–0.07)
Basophils Absolute: 0 10*3/uL (ref 0.0–0.1)
Basophils Relative: 0 %
Eosinophils Absolute: 0 10*3/uL (ref 0.0–0.5)
Eosinophils Relative: 0 %
HCT: 34.9 % — ABNORMAL LOW (ref 39.0–52.0)
Hemoglobin: 11.7 g/dL — ABNORMAL LOW (ref 13.0–17.0)
Immature Granulocytes: 3 %
Lymphocytes Relative: 7 %
Lymphs Abs: 0.9 10*3/uL (ref 0.7–4.0)
MCH: 29.4 pg (ref 26.0–34.0)
MCHC: 33.5 g/dL (ref 30.0–36.0)
MCV: 87.7 fL (ref 80.0–100.0)
Monocytes Absolute: 0.6 10*3/uL (ref 0.1–1.0)
Monocytes Relative: 5 %
Neutro Abs: 10.5 10*3/uL — ABNORMAL HIGH (ref 1.7–7.7)
Neutrophils Relative %: 85 %
Platelets: 255 10*3/uL (ref 150–400)
RBC: 3.98 MIL/uL — ABNORMAL LOW (ref 4.22–5.81)
RDW: 12.4 % (ref 11.5–15.5)
WBC: 12.4 10*3/uL — ABNORMAL HIGH (ref 4.0–10.5)
nRBC: 0 % (ref 0.0–0.2)

## 2019-07-12 LAB — PROTIME-INR
INR: 1 (ref 0.8–1.2)
Prothrombin Time: 13.3 seconds (ref 11.4–15.2)

## 2019-07-12 LAB — APTT: aPTT: 34 seconds (ref 24–36)

## 2019-07-12 LAB — COMPREHENSIVE METABOLIC PANEL
ALT: 20 U/L (ref 0–44)
AST: 40 U/L (ref 15–41)
Albumin: 2.7 g/dL — ABNORMAL LOW (ref 3.5–5.0)
Alkaline Phosphatase: 101 U/L (ref 38–126)
Anion gap: 9 (ref 5–15)
BUN: 12 mg/dL (ref 6–20)
CO2: 24 mmol/L (ref 22–32)
Calcium: 8.4 mg/dL — ABNORMAL LOW (ref 8.9–10.3)
Chloride: 102 mmol/L (ref 98–111)
Creatinine, Ser: 0.76 mg/dL (ref 0.61–1.24)
GFR calc Af Amer: >60 mL/min (ref >60)
GFR calc non Af Amer: >60 mL/min (ref >60)
Glucose, Bld: 126 mg/dL — ABNORMAL HIGH (ref 70–99)
Potassium: 3.6 mmol/L (ref 3.5–5.1)
Sodium: 135 mmol/L (ref 135–145)
Total Bilirubin: 0.7 mg/dL (ref 0.3–1.2)
Total Protein: 7.5 g/dL (ref 6.5–8.1)

## 2019-07-12 LAB — PROCALCITONIN: Procalcitonin: <0.1 ng/mL

## 2019-07-12 LAB — LACTIC ACID, PLASMA
Lactic Acid, Venous: 1.3 mmol/L (ref 0.5–1.9)
Lactic Acid, Venous: 0.9 mmol/L (ref 0.5–1.9)

## 2019-07-12 LAB — TROPONIN I (HIGH SENSITIVITY): Troponin I (High Sensitivity): 8 ng/L (ref <18)

## 2019-07-12 LAB — LIPASE, BLOOD: Lipase: 25 U/L (ref 11–51)

## 2019-07-12 MED ORDER — IOHEXOL 350 MG/ML SOLN
75.0000 mL | Freq: Once | INTRAVENOUS | Status: AC | PRN
  Administered 2019-07-12: 22:00:00 75 mL via INTRAVENOUS

## 2019-07-12 MED ORDER — SULFAMETHOXAZOLE-TRIMETHOPRIM 400-80 MG/5ML IV SOLN
15.0000 mg/kg/d | Freq: Four times a day (QID) | INTRAVENOUS | Status: DC
  Administered 2019-07-12 – 2019-07-15 (×9): 152.96 mg via INTRAVENOUS
  Filled 2019-07-12 (×18): qty 9.56

## 2019-07-12 MED ORDER — ONDANSETRON HCL 4 MG PO TABS: 4.0000 mg | ORAL_TABLET | Freq: Four times a day (QID) | ORAL | Status: DC | PRN

## 2019-07-12 MED ORDER — ACETAMINOPHEN 650 MG RE SUPP: 650.0000 mg | Freq: Four times a day (QID) | RECTAL | Status: DC | PRN

## 2019-07-12 MED ORDER — ONDANSETRON HCL 4 MG/2ML IJ SOLN: 4.0000 mg | Freq: Four times a day (QID) | INTRAMUSCULAR | Status: DC | PRN

## 2019-07-12 MED ORDER — VANCOMYCIN HCL IN DEXTROSE 1-5 GM/200ML-% IV SOLN
1000.0000 mg | Freq: Once | INTRAVENOUS | Status: AC
  Administered 2019-07-12: 21:00:00 1000 mg via INTRAVENOUS
  Filled 2019-07-12: qty 200

## 2019-07-12 MED ORDER — LACTATED RINGERS IV SOLN: INTRAVENOUS | Status: DC

## 2019-07-12 MED ORDER — ENOXAPARIN SODIUM 40 MG/0.4ML ~~LOC~~ SOLN
40.0000 mg | SUBCUTANEOUS | Status: DC
  Administered 2019-07-13 – 2019-07-16 (×4): 40 mg via SUBCUTANEOUS
  Filled 2019-07-12 (×4): qty 0.4

## 2019-07-12 MED ORDER — SODIUM CHLORIDE 0.9 % IV SOLN
2.0000 g | Freq: Once | INTRAVENOUS | Status: AC
  Administered 2019-07-12: 2 g via INTRAVENOUS
  Filled 2019-07-12: qty 2

## 2019-07-12 MED ORDER — PREDNISONE 20 MG PO TABS
40.0000 mg | ORAL_TABLET | Freq: Two times a day (BID) | ORAL | Status: DC
  Administered 2019-07-13 – 2019-07-16 (×7): 40 mg via ORAL
  Filled 2019-07-12 (×7): qty 2

## 2019-07-12 MED ORDER — SODIUM CHLORIDE 0.9 % IV BOLUS
1000.0000 mL | Freq: Once | INTRAVENOUS | Status: AC
  Administered 2019-07-12: 1000 mL via INTRAVENOUS

## 2019-07-12 MED ORDER — ACETAMINOPHEN 325 MG PO TABS: 650.0000 mg | ORAL_TABLET | Freq: Four times a day (QID) | ORAL | Status: DC | PRN

## 2019-07-12 MED ORDER — METRONIDAZOLE IN NACL 5-0.79 MG/ML-% IV SOLN
500.0000 mg | Freq: Once | INTRAVENOUS | Status: AC
  Administered 2019-07-12: 21:00:00 500 mg via INTRAVENOUS
  Filled 2019-07-12: qty 100

## 2019-07-12 MED ORDER — MORPHINE SULFATE (PF) 4 MG/ML IV SOLN
4.0000 mg | Freq: Once | INTRAVENOUS | Status: AC
  Administered 2019-07-12: 4 mg via INTRAVENOUS
  Filled 2019-07-12: qty 1

## 2019-07-12 NOTE — ED Notes (Signed)
Pt initially placed on 15 L NRB and SpO2 100%. Decreased NRB to 10L, maintained 100%

## 2019-07-12 NOTE — ED Triage Notes (Signed)
Pt arrived via POV with boyfriend with reports of over the last week he was treated for GERD but reported shortness of breath and today presented with worsening shortness of breath.  Pt is pale, tachycardic, tachypnea.   Pt reports feeling bad x 2 months.  Pt was found on arrival to oxygen sats in the 60s.  Pt taken to Room 4 and placed on 15L NRB.

## 2019-07-12 NOTE — H&P (Signed)
History and Physical    Kalman Nylen HYW:[id number] DOB: 11/23/1990 DOA: 07/12/2019  PCP: Patient, No Pcp Per  Patient coming from: Home  I have personally briefly reviewed patient's old medical records in [place]  Chief Complaint: Dyspnea  HPI: Sean Ayers is a 29 y.o. male with medical history significant for appendicitis s/p appendectomy 01/22/2017 and recently diagnosed HIV who presents to the ED for evaluation of dyspnea.  Patient reports approximately 3 months of progressive shortness of breath, intermittent fevers and diaphoresis, 30 pound weight loss, poor oral intake, and cough occasionally productive of yellow sputum.  He was seen in the ED on 06/27/2019 and was noted to have oral candidiasis with suspicion for esophageal candidiasis.  He was given fluconazole.  2 view chest x-ray showed new faint bilateral pulmonary infiltrates.  An HIV antibody test was obtained and ultimately resulted positive.  [facility] attempted to contact patient without success.  Patient states that he was contacted yesterday and notified of his positive HIV test result.  He reports a history of tobacco use, was smoking about 1 pack/day since age 21 but states he has since quit smoking.  He reports occasional marijuana use.  He denies any cocaine or IV drug use.  He states he is currently not taking any medications other than Tylenol as needed.  ED Course:  Initial vitals showed BP 120/92, pulse 106, RR 33, temp 100.0 Fahrenheit, SPO2 62% on room air.  Patient was placed on NRB at 15 L with SPO2 100% room air and subsequently weaned down to 4 L supplemental O2 via [place].  Labs are notable for WBC 12.4, hemoglobin 11.7, platelets 255,000, sodium 135, potassium 3.6, bicarb 24, BUN 12, creatinine 0.76, lactic acid 1.3, high-sensitivity troponin I 8, lipase 25, procalcitonin <0.10.  POC SARS-CoV-2 antigen is negative.  SARS-CoV-2 PCR panel is ordered and pending.  Blood cultures were obtained and  pending.  Portable chest x-ray shows interval worsening of left greater than right hazy groundglass opacities.  CTA chest PE study is negative for PE.  Diffuse bilateral groundglass airspace disease is noted with upper lobe predominant cystic changes.  Dense left lower lobe consolidation also noted.  Patient was given 1 L normal saline, IV vancomycin, metronidazole, cefepime, and Bactrim.  The hospitalist service was consulted to admit for further evaluation and management.  Review of Systems: All systems reviewed and are negative except as documented in history of present illness above.   Past Medical History:  Diagnosis Date  . Acute appendicitis   . Closed fracture of medial malleolus of right ankle 04/22/2018    Past Surgical History:  Procedure Laterality Date  . LAPAROSCOPIC APPENDECTOMY N/A 01/22/2017   Procedure: APPENDECTOMY LAPAROSCOPIC;  Surgeon: Clayburn Pert, MD;  Location: [facility] ORS;  Service: General;  Laterality: N/A;    Social History:  reports that he has been smoking cigarettes. He has been smoking about 0.10 packs per day. He has never used smokeless tobacco. He reports that he does not drink alcohol or use drugs.  No Known Allergies  Family History  Problem Relation Age of Onset  . Healthy Mother   . Healthy Father   . Brain cancer Other      Prior to Admission medications   Medication Sig Start Date End Date Taking? Authorizing Provider  amoxicillin (AMOXIL) 875 MG tablet Take 1 tablet (875 mg total) by mouth 2 (two) times daily. 08/03/17   Cuthriell, Charline Bills, PA-C  ibuprofen (ADVIL,MOTRIN) 600 MG tablet Take 1 tablet (  600 mg total) by mouth every 6 (six) hours as needed. 04/22/18   Enid Derry, PA-C  magic mouthwash w/lidocaine SOLN Take 5 mLs by mouth 4 (four) times daily. 08/03/17   Cuthriell, Delorise Royals, PA-C  oxyCODONE (OXY IR/ROXICODONE) 5 MG immediate release tablet Take 1 tablet (5 mg total) by mouth every 6 (six) hours as needed for moderate  pain. 01/29/17   Ricarda Frame, MD    Physical Exam: Vitals:   07/12/19 2115 07/12/19 2130 07/12/19 2201 07/12/19 2224  BP:  120/74    Pulse: (!) 106 (!) 103    Resp: (!) 24 15    Temp:    98.5 F (36.9 C)  TempSrc:    Oral  SpO2: 100% 100% 100% 100%  Weight:      Height:       Constitutional: Thin man resting in bed, NAD, calm, comfortable Eyes: PERRL, lids and conjunctivae normal ENMT: Lips and mucous membranes are dry. Posterior pharynx clear of any exudate or lesions.Normal dentition.  Neck: normal, supple, no masses. Respiratory: Coarse expiratory breath sounds left upper lung field otherwise clear to auscultation. Normal respiratory effort. No accessory muscle use.  Cardiovascular: Regular rate and rhythm, no murmurs / rubs / gallops. No extremity edema. 2+ pedal pulses. Abdomen: no tenderness, no masses palpated. No hepatosplenomegaly. Bowel sounds positive.  Musculoskeletal: no clubbing / cyanosis. No joint deformity upper and lower extremities. Good ROM, no contractures. Normal muscle tone.  Skin: no rashes, lesions, ulcers. No induration Neurologic: CN 2-12 grossly intact. Sensation intact, Strength 5/5 in all 4.  Psychiatric: Normal judgment and insight. Alert and oriented x 3. Normal mood.    Labs on Admission: I have personally reviewed following labs and imaging studies  CBC: Recent Labs  Lab 07/12/19 2047  WBC 12.4*  NEUTROABS 10.5*  HGB 11.7*  HCT 34.9*  MCV 87.7  PLT 255   Basic Metabolic Panel: Recent Labs  Lab 07/12/19 2047  NA 135  K 3.6  CL 102  CO2 24  GLUCOSE 126*  BUN 12  CREATININE 0.76  CALCIUM 8.4*   GFR: Estimated Creatinine Clearance: 78.6 mL/min (by C-G formula based on SCr of 0.76 mg/dL). Liver Function Tests: Recent Labs  Lab 07/12/19 2047  AST 40  ALT 20  ALKPHOS 101  BILITOT 0.7  PROT 7.5  ALBUMIN 2.7*   Recent Labs  Lab 07/12/19 2047  LIPASE 25   No results for input(s): AMMONIA in the last 168  hours. Coagulation Profile: Recent Labs  Lab 07/12/19 2047  INR 1.0   Cardiac Enzymes: No results for input(s): CKTOTAL, CKMB, CKMBINDEX, TROPONINI in the last 168 hours. BNP (last 3 results) No results for input(s): PROBNP in the last 8760 hours. HbA1C: No results for input(s): HGBA1C in the last 72 hours. CBG: No results for input(s): GLUCAP in the last 168 hours. Lipid Profile: No results for input(s): CHOL, HDL, LDLCALC, TRIG, CHOLHDL, LDLDIRECT in the last 72 hours. Thyroid Function Tests: No results for input(s): TSH, T4TOTAL, FREET4, T3FREE, THYROIDAB in the last 72 hours. Anemia Panel: No results for input(s): VITAMINB12, FOLATE, FERRITIN, TIBC, IRON, RETICCTPCT in the last 72 hours. Urine analysis:    Component Value Date/Time   COLORURINE AMBER (A) 06/27/2019 1201   APPEARANCEUR CLOUDY (A) 06/27/2019 1201   LABSPEC 1.025 06/27/2019 1201   PHURINE 6.0 06/27/2019 1201   GLUCOSEU NEGATIVE 06/27/2019 1201   HGBUR NEGATIVE 06/27/2019 1201   BILIRUBINUR NEGATIVE 06/27/2019 1201   KETONESUR NEGATIVE 06/27/2019 1201  PROTEINUR 30 (A) 06/27/2019 1201   NITRITE NEGATIVE 06/27/2019 1201   LEUKOCYTESUR NEGATIVE 06/27/2019 1201    Radiological Exams on Admission: CT Angio Chest PE W and/or Wo Contrast  Result Date: 07/12/2019 CLINICAL DATA:  Worsening shortness of breath, tachycardia, tachypnea EXAM: CT ANGIOGRAPHY CHEST WITH CONTRAST TECHNIQUE: Multidetector CT imaging of the chest was performed using the standard protocol during bolus administration of intravenous contrast. Multiplanar CT image reconstructions and MIPs were obtained to evaluate the vascular anatomy. CONTRAST:  79mL OMNIPAQUE IOHEXOL 350 MG/ML SOLN COMPARISON:  07/12/2019 FINDINGS: Cardiovascular: This is a technically adequate evaluation of the pulmonary vasculature. No filling defects or pulmonary emboli. Heart is unremarkable without pericardial effusion. Thoracic aorta is unremarkable without aneurysm or  dissection. Mediastinum/Nodes: No enlarged mediastinal, hilar, or axillary lymph nodes. Thyroid gland, trachea, and esophagus demonstrate no significant findings. Lungs/Pleura: Diffuse ground-glass airspace disease is seen throughout the lungs, without lobar predilection. Denser consolidation at the left lung base may reflect superimposed atelectasis or dense airspace disease. Cystic changes are seen within the upper lung zones. No effusion or pneumothorax. The central airways are patent. Upper Abdomen: Limited imaging through the upper abdomen is unremarkable. Musculoskeletal: There are no acute or destructive bony lesions. Reconstructed images demonstrate no additional findings. Review of the MIP images confirms the above findings. IMPRESSION: 1. No evidence of pulmonary embolus. 2. Diffuse bilateral ground-glass airspace disease, with upper lobe predominant cystic changes as above. If the patient is immunocompromised, atypical infections such as pneumocystis could give this pattern. Hypersensitivity pneumonitis, drug toxicity, or nonspecific interstitial pneumonitis could give a similar pattern. 3. Dense left lower lobe consolidation which could reflect atelectasis or superimposed bronchopneumonia. Electronically Signed   By: Sharlet Salina M.D.   On: 07/12/2019 22:32   DG Chest Port 1 View  Result Date: 07/12/2019 CLINICAL DATA:  Shortness of breath EXAM: PORTABLE CHEST 1 VIEW COMPARISON:  06/27/2019 FINDINGS: Worsened bilateral hazy and ground-glass opacities left greater than right. Normal heart size. No pleural effusion or pneumothorax. IMPRESSION: Interval worsening of left greater than right hazy and ground-glass opacities, possibly due to pneumonia, to include atypical etiologies. Electronically Signed   By: Jasmine Pang M.D.   On: 07/12/2019 20:54    EKG: Independently reviewed. Sinus tachycardia, rate 127, rate is faster when compared to prior.  Assessment/Plan Principal Problem:   Acute  respiratory failure with hypoxia (HCC) Active Problems:   HIV (human immunodeficiency virus infection) (HCC)  Sean Ayers is a 29 y.o. male with medical history significant for appendicitis s/p appendectomy 01/22/2017 and recently diagnosed HIV who is admitted with acute respiratory failure with hypoxia due to suspected pneumocystis pneumonia.  Acute respiratory failure with hypoxia due to suspected pneumocystis pneumonia: Patient with significant hypoxia on arrival initially requiring nonrebreather at 15 L now weaned down to 4 L supplemental O2 via [place]. CTA chest shows diffuse bilateral groundglass airspace disease with upper lobe predominant cystic changes. -Continue IV Bactrim 15 mg/kg/day -Will start prednisone 40 mg twice daily -Continue IV fluid hydration overnight -Continue supplemental oxygen and wean down as able -Follow blood cultures -Infectious disease consultation requested  Newly diagnosed HIV: HIV antibody positive 06/27/2019 -differentiation showed HIV 1 antibody positive, HIV 2 antibody negative.  Patient found out about positive test day before admission. -CD4, HIV RNA pending -Check hepatitis B and C serologies -Infectious disease to follow  DVT prophylaxis: Lovenox Code Status: Full code Family Communication: Patient has discussed with his significant other, he has not discussed with his family yet. Disposition  Plan: From home, disposition pending further evaluation and management of acute respiratory failure with hypoxia and infectious disease consultation for likely initiation of antiretroviral therapy. Consults called: Infectious disease consult requested Admission status: Admit - It is my clinical opinion that admission to INPATIENT is reasonable and necessary because of the expectation that this patient will require hospital care that crosses at least 2 midnights to treat this condition based on the medical complexity of the problems presented.  Given the  aforementioned information, the predictability of an adverse outcome is felt to be significant.    Darreld Mclean MD [facility]  If 7PM-7AM, please contact night-coverage [email or web address]  07/12/2019, 11:00 PM

## 2019-07-12 NOTE — ED Provider Notes (Signed)
[facility] Emergency Department Provider Note  Time seen: 8:40 PM  I have reviewed the triage vital signs and the nursing notes.   HISTORY  Chief Complaint Respiratory Distress   HPI Sean Ayers is a 29 y.o. male with a recent HIV diagnosis per record review presents to the emergency department  with respiratory distress.  Patient states for the past 2 months he has not been feeling well with generalized fatigue weakness and progressively worsening shortness of breath.  Was seen in the emergency department 2 weeks ago at that time had an HIV test that resulted positive the patient was made aware of this test he states yesterday has not been for any follow-up appointments as of yet.  Patient states he has had on and off fever, currently 100.0.  Patient describes central chest pain worse with deep inspiration.  On 06/27/2019 patient did have slight hazy opacities on his chest x-ray.  Patient denies any unilateral leg swelling or pain.  No history of DVT.  Patient takes no medications.  Past Medical History:  Diagnosis Date  . Acute appendicitis   . Closed fracture of medial malleolus of right ankle 04/22/2018    Patient Active Problem List   Diagnosis Date Noted  . Ruptured appendicitis 01/22/2017  . Acute appendicitis     Past Surgical History:  Procedure Laterality Date  . LAPAROSCOPIC APPENDECTOMY N/A 01/22/2017   Procedure: APPENDECTOMY LAPAROSCOPIC;  Surgeon: Clayburn Pert, MD;  Location: [facility] ORS;  Service: General;  Laterality: N/A;    Prior to Admission medications   Medication Sig Start Date End Date Taking? Authorizing Provider  amoxicillin (AMOXIL) 875 MG tablet Take 1 tablet (875 mg total) by mouth 2 (two) times daily. 08/03/17   Cuthriell, Charline Bills, PA-C  ibuprofen (ADVIL,MOTRIN) 600 MG tablet Take 1 tablet (600 mg total) by mouth every 6 (six) hours as needed. 04/22/18   Laban Emperor, PA-C  magic mouthwash w/lidocaine SOLN Take 5 mLs by  mouth 4 (four) times daily. 08/03/17   Cuthriell, Charline Bills, PA-C  oxyCODONE (OXY IR/ROXICODONE) 5 MG immediate release tablet Take 1 tablet (5 mg total) by mouth every 6 (six) hours as needed for moderate pain. 01/29/17   Clayburn Pert, MD    No Known Allergies  Family History  Problem Relation Age of Onset  . Healthy Mother   . Healthy Father   . Brain cancer Other     Social History Social History   Tobacco Use  . Smoking status: Current Every Day Smoker    Packs/day: 0.10    Types: Cigarettes  . Smokeless tobacco: Never Used  Substance Use Topics  . Alcohol use: No  . Drug use: No    Review of Systems Constitutional: Intermittent fever per patient 100.0 today. ENT: Negative for recent illness/congestion Cardiovascular: Positive for central chest pain worse with deep inspiration. Respiratory: Positive for shortness of breath progressively worsening over the past 2 months denies any acute decline. Gastrointestinal: Negative for abdominal pain.  Negative for vomiting or diarrhea.  Negative for black or bloody stool. Genitourinary: Negative for urinary compaints Musculoskeletal: Negative for musculoskeletal complaints Neurological: Negative for headache All other ROS negative  ____________________________________________   PHYSICAL EXAM:  VITAL SIGNS: ED Triage Vitals [07/12/19 2036]  Enc Vitals Group     BP (!) 141/95     Pulse Rate (!) 134     Resp (!) 30     Temp 100 F (37.8 C)     Temp Source Oral  SpO2 100 %     Weight      Height      Head Circumference      Peak Flow      Pain Score      Pain Loc      Pain Edu?      Excl. in GC?    Constitutional: Patient is awake alert oriented moderate respiratory distress with significant tachypnea sitting upright in bed with a nonrebreather. Eyes: Normal exam ENT      Head: Normocephalic and atraumatic.      Mouth/Throat: Mucous membranes are moist. Cardiovascular: Regular rhythm rate around 130 bpm.   No obvious murmur. Respiratory: Normal respiratory effort without tachypnea nor retractions. Breath sounds are clear  Gastrointestinal: Soft and nontender. No distention.  Musculoskeletal: Nontender with normal range of motion in all extremities.  Neurologic:  Normal speech and language. No gross focal neurologic deficits Skin:  Skin is warm, dry and intact.  Psychiatric: Mood and affect are normal.   ____________________________________________    EKG  EKG viewed and interpreted by myself shows sinus tachycardia 127 bpm with a narrow QRS, normal axis, normal intervals, nonspecific ST changes.  ____________________________________________    RADIOLOGY  CT appears to be most consistent with pneumocystis pneumonia/atypical pneumonia.  ____________________________________________   INITIAL IMPRESSION / ASSESSMENT AND PLAN / ED COURSE  Pertinent labs & imaging results that were available during my care of the patient were reviewed by me and considered in my medical decision making (see chart for details).   Patient presents emergency department for chest pain shortness of breath, found to have a room air saturation around 60%.  Patient placed on a nonrebreather currently satting around 100% on a nonrebreather.  Patient is quite tachypneic around 30+ breaths per minute, tachycardic around 130.  States chest pain worse with deep inspiration.  Has a low-grade temperature 100.0.  We will check labs, cultures, cover with broad-spectrum antibiotics while waiting further work-up.  We will repeat a chest x-ray patient will likely require CT scan as well.  Given the patient's temperature we will also check a Covid swab.  Differential is quite broad given recent HIV diagnosis pneumonia would be high on the differential, Covid virus, given his acute hypoxia pneumothorax would be on the differential as well, ACS or CHF.  CT appears to be consistent with pneumocystis pneumonia.  Rapid Covid was  negative.  PCR Covid has been sent as a precaution.  I have added on IV Bactrim to cover for PCP pneumonia in addition to his broad-spectrum antibiotics already receiving.  Discussed with the hospitalist, ultimately patient will require infectious disease consultation.  Sean Ayers was evaluated in Emergency Department on 07/12/2019 for the symptoms described in the history of present illness. He was evaluated in the context of the global COVID-19 pandemic, which necessitated consideration that the patient might be at risk for infection with the SARS-CoV-2 virus that causes COVID-19. Institutional protocols and algorithms that pertain to the evaluation of patients at risk for COVID-19 are in a state of rapid change based on information released by regulatory bodies including the CDC and federal and state organizations. These policies and algorithms were followed during the patient's care in the ED.  CRITICAL CARE Performed by: Minna Antis   Total critical care time: 45 minutes  Critical care time was exclusive of separately billable procedures and treating other patients.  Critical care was necessary to treat or prevent imminent or life-threatening deterioration.  Critical care was time spent  personally by me on the following activities: development of treatment plan with patient and/or surrogate as well as nursing, discussions with consultants, evaluation of patient's response to treatment, examination of patient, obtaining history from patient or surrogate, ordering and performing treatments and interventions, ordering and review of laboratory studies, ordering and review of radiographic studies, pulse oximetry and re-evaluation of patient's condition.   ____________________________________________   FINAL CLINICAL IMPRESSION(S) / ED DIAGNOSES  Hypoxia Dyspnea Chest pain Pneumonia Sepsis   Minna Antis, MD 07/12/19 2255

## 2019-07-12 NOTE — Progress Notes (Signed)
CODE SEPSIS - PHARMACY COMMUNICATION  **Broad Spectrum Antibiotics should be administered within 1 hour of Sepsis diagnosis**  Time Code Sepsis Called/Page Received:  2/23 @ 2039  Antibiotics Ordered: Vanc, Cefepime, Metronidazole  Time of 1st antibiotic administration:   Cefepime 2 gm IV X 1 given on 2/23 @ 2057  Additional action taken by pharmacy:   If necessary, Name of Provider/Nurse Contacted:     Cendy Oconnor D ,PharmD Clinical Pharmacist  07/12/2019  8:58 PM

## 2019-07-12 NOTE — ED Notes (Addendum)
Admitting provider in room to assess patient and inform him of plan of care.

## 2019-07-12 NOTE — Progress Notes (Signed)
PHARMACY -  BRIEF ANTIBIOTIC NOTE   Pharmacy has received consult(s) for Vancomycin, Cefepime from an ED provider.  The patient's profile has been reviewed for ht/wt/allergies/indication/available labs.    One time order(s) placed for  Vanc 1 gm IV X 1 and Cefepime 2 gm IV X 1  Further antibiotics/pharmacy consults should be ordered by admitting physician if indicated.                       Thank you, Chantrice Hagg D 07/12/2019  9:04 PM

## 2019-07-12 NOTE — ED Notes (Signed)
Pt to CT

## 2019-07-12 NOTE — ED Notes (Signed)
Pt has requested that S/O [name] (listed in chart) is approved to get updates on pt status. Pt requests that no one from his family can receive updates or know that he is here

## 2019-07-13 ENCOUNTER — Other Ambulatory Visit: Payer: Self-pay

## 2019-07-13 ENCOUNTER — Encounter: Payer: Self-pay | Admitting: Internal Medicine

## 2019-07-13 DIAGNOSIS — B20 Human immunodeficiency virus [HIV] disease: Principal | ICD-10-CM

## 2019-07-13 DIAGNOSIS — F1490 Cocaine use, unspecified, uncomplicated: Secondary | ICD-10-CM

## 2019-07-13 DIAGNOSIS — E46 Unspecified protein-calorie malnutrition: Secondary | ICD-10-CM

## 2019-07-13 DIAGNOSIS — R918 Other nonspecific abnormal finding of lung field: Secondary | ICD-10-CM

## 2019-07-13 DIAGNOSIS — J9691 Respiratory failure, unspecified with hypoxia: Secondary | ICD-10-CM

## 2019-07-13 DIAGNOSIS — D638 Anemia in other chronic diseases classified elsewhere: Secondary | ICD-10-CM

## 2019-07-13 DIAGNOSIS — F17210 Nicotine dependence, cigarettes, uncomplicated: Secondary | ICD-10-CM

## 2019-07-13 LAB — BLOOD GAS, ARTERIAL
Acid-base deficit: 0.1 mmol/L (ref 0.0–2.0)
Bicarbonate: 23.9 mmol/L (ref 20.0–28.0)
FIO2: 10
O2 Saturation: 98.4 %
Patient temperature: 37
pCO2 arterial: 36 mmHg (ref 32.0–48.0)
pH, Arterial: 7.43 (ref 7.350–7.450)
pO2, Arterial: 110 mmHg — ABNORMAL HIGH (ref 83.0–108.0)

## 2019-07-13 LAB — BASIC METABOLIC PANEL
Anion gap: 6 (ref 5–15)
BUN: 10 mg/dL (ref 6–20)
CO2: 25 mmol/L (ref 22–32)
Calcium: 7.7 mg/dL — ABNORMAL LOW (ref 8.9–10.3)
Chloride: 104 mmol/L (ref 98–111)
Creatinine, Ser: 0.77 mg/dL (ref 0.61–1.24)
GFR calc Af Amer: >60 mL/min (ref >60)
GFR calc non Af Amer: >60 mL/min (ref >60)
Glucose, Bld: 104 mg/dL — ABNORMAL HIGH (ref 70–99)
Potassium: 3.5 mmol/L (ref 3.5–5.1)
Sodium: 135 mmol/L (ref 135–145)

## 2019-07-13 LAB — RESPIRATORY PANEL BY PCR

## 2019-07-13 LAB — PHOSPHORUS: Phosphorus: 4.5 mg/dL (ref 2.5–4.6)

## 2019-07-13 LAB — URINE DRUG SCREEN, QUALITATIVE (ARMC ONLY)
Amphetamines, Ur Screen: NOT DETECTED
Barbiturates, Ur Screen: NOT DETECTED
Benzodiazepine, Ur Scrn: NOT DETECTED
Cannabinoid 50 Ng, Ur ~~LOC~~: NOT DETECTED
Cocaine Metabolite,Ur ~~LOC~~: POSITIVE — AB
MDMA (Ecstasy)Ur Screen: NOT DETECTED
Methadone Scn, Ur: NOT DETECTED
Opiate, Ur Screen: POSITIVE — AB
Phencyclidine (PCP) Ur S: NOT DETECTED
Tricyclic, Ur Screen: NOT DETECTED

## 2019-07-13 LAB — URINALYSIS, ROUTINE W REFLEX MICROSCOPIC
Bilirubin Urine: NEGATIVE
Glucose, UA: NEGATIVE mg/dL
Hgb urine dipstick: NEGATIVE
Ketones, ur: NEGATIVE mg/dL
Leukocytes,Ua: NEGATIVE
Nitrite: NEGATIVE
Protein, ur: NEGATIVE mg/dL
Specific Gravity, Urine: 1.043 — ABNORMAL HIGH (ref 1.005–1.030)
pH: 7 (ref 5.0–8.0)

## 2019-07-13 LAB — CBC
HCT: 29.9 % — ABNORMAL LOW (ref 39.0–52.0)
Hemoglobin: 9.7 g/dL — ABNORMAL LOW (ref 13.0–17.0)
MCH: 29 pg (ref 26.0–34.0)
MCHC: 32.4 g/dL (ref 30.0–36.0)
MCV: 89.5 fL (ref 80.0–100.0)
Platelets: 220 10*3/uL (ref 150–400)
RBC: 3.34 MIL/uL — ABNORMAL LOW (ref 4.22–5.81)
RDW: 12.3 % (ref 11.5–15.5)
WBC: 3.7 10*3/uL — ABNORMAL LOW (ref 4.0–10.5)
nRBC: 0 % (ref 0.0–0.2)

## 2019-07-13 LAB — RESPIRATORY PANEL BY RT PCR (FLU A&B, COVID)
Influenza A by PCR: NEGATIVE
Influenza B by PCR: NEGATIVE
SARS Coronavirus 2 by RT PCR: NEGATIVE

## 2019-07-13 LAB — HEPATITIS B CORE ANTIBODY, TOTAL: Hep B Core Total Ab: NONREACTIVE

## 2019-07-13 LAB — LACTATE DEHYDROGENASE: LDH: 292 U/L — ABNORMAL HIGH (ref 98–192)

## 2019-07-13 LAB — HEPATITIS B SURFACE ANTIGEN: Hepatitis B Surface Ag: NONREACTIVE

## 2019-07-13 LAB — TROPONIN I (HIGH SENSITIVITY): Troponin I (High Sensitivity): 7 ng/L (ref <18)

## 2019-07-13 LAB — MAGNESIUM: Magnesium: 2 mg/dL (ref 1.7–2.4)

## 2019-07-13 MED ORDER — SODIUM CHLORIDE 0.9 % IV SOLN
INTRAVENOUS | Status: DC | PRN
  Administered 2019-07-13: 13:00:00 10 mL/h via INTRAVENOUS

## 2019-07-13 MED ORDER — ENSURE ENLIVE PO LIQD
237.0000 mL | Freq: Three times a day (TID) | ORAL | Status: DC
  Administered 2019-07-13 – 2019-07-16 (×9): 237 mL via ORAL

## 2019-07-13 MED ORDER — OXYCODONE HCL 5 MG PO TABS
5.0000 mg | ORAL_TABLET | Freq: Four times a day (QID) | ORAL | Status: DC | PRN
  Administered 2019-07-13 – 2019-07-16 (×11): 5 mg via ORAL
  Filled 2019-07-13 (×11): qty 1

## 2019-07-13 MED ORDER — IPRATROPIUM-ALBUTEROL 0.5-2.5 (3) MG/3ML IN SOLN
RESPIRATORY_TRACT | Status: AC
  Filled 2019-07-13: qty 3

## 2019-07-13 MED ORDER — ADULT MULTIVITAMIN W/MINERALS CH
1.0000 | ORAL_TABLET | Freq: Every day | ORAL | Status: DC
  Administered 2019-07-14 – 2019-07-16 (×3): 1 via ORAL
  Filled 2019-07-13 (×3): qty 1

## 2019-07-13 MED ORDER — IPRATROPIUM-ALBUTEROL 0.5-2.5 (3) MG/3ML IN SOLN
3.0000 mL | RESPIRATORY_TRACT | Status: DC | PRN
  Administered 2019-07-13: 3 mL via RESPIRATORY_TRACT

## 2019-07-13 NOTE — Progress Notes (Signed)
Received pt from ED via stretcher.  Pt A,Ox3, pt became SOB with mild exertion.  Pt oriented to room and call bell system, call bell in reach and pt verbalized understanding of use.

## 2019-07-13 NOTE — Consult Note (Signed)
NAME: Sean Ayers  DOB: [birth year]  MRN: [id number]  Date/Time: 07/13/2019 11:00 AM  REQUESTING PROVIDER: Dr.PAtel Subjective:  REASON FOR CONSULT: PCP pneumonia ? Sean Ayers is a 29 y.o. male presented to the ED With increasing sob, heartburns. He was recently diagnosed with HIV in Feb  Pt has been feeling unwell for the past 3 months- it started with him not able to eat, pain on swallowing, cough, then weight loss and sob- he did his google research and thought it was GERD . He then came to the ED on 06/27/19 and they noted he had oral candidiasis and gave him fluconazole also tested him for HIV which was positive- the disease intervention specialist [place] reached him and they made an appt for him for 07/28/19 at the [facility]. As he was getting worse and his breathing was getting worse he came to the ED. Pt lives with his male partner who has not been tested yet. Pt used to work in a [employer] until 2 weeks ago. He lost 40 pounds in the past few months He has night sweats Denies diarrhea  Past Medical History:  Diagnosis Date  . Acute appendicitis   . Closed fracture of medial malleolus of right ankle 04/22/2018    Past Surgical History:  Procedure Laterality Date  . LAPAROSCOPIC APPENDECTOMY N/A 01/22/2017   Procedure: APPENDECTOMY LAPAROSCOPIC;  Surgeon: Clayburn Pert, MD;  Location: [facility] ORS;  Service: General;  Laterality: N/A;    Social History   Socioeconomic History  . Marital status: Single    Spouse name: Not on file  . Number of children: Not on file  . Years of education: Not on file  . Highest education level: Not on file  Occupational History  . Not on file  Tobacco Use  . Smoking status: Current Every Day Smoker    Packs/day: 0.10    Types: Cigarettes  . Smokeless tobacco: Never Used  Substance and Sexual Activity  . Alcohol use: No  . Drug use: No  . Sexual activity: Not on file  Other Topics Concern  . Not on file  Social History Narrative    . Not on file   Social Determinants of Health   Financial Resource Strain:   . Difficulty of Paying Living Expenses: Not on file  Food Insecurity:   . Worried About Charity fundraiser in the Last Year: Not on file  . Ran Out of Food in the Last Year: Not on file  Transportation Needs:   . Lack of Transportation (Medical): Not on file  . Lack of Transportation (Non-Medical): Not on file  Physical Activity:   . Days of Exercise per Week: Not on file  . Minutes of Exercise per Session: Not on file  Stress:   . Feeling of Stress : Not on file  Social Connections:   . Frequency of Communication with Friends and Family: Not on file  . Frequency of Social Gatherings with Friends and Family: Not on file  . Attends Religious Services: Not on file  . Active Member of Clubs or Organizations: Not on file  . Attends Archivist Meetings: Not on file  . Marital Status: Not on file  Intimate Partner Violence:   . Fear of Current or Ex-Partner: Not on file  . Emotionally Abused: Not on file  . Physically Abused: Not on file  . Sexually Abused: Not on file    Family History  Problem Relation Age of Onset  . Healthy Mother   .  Healthy Father   . Brain cancer Other    No Known Allergies  ? Current Facility-Administered Medications  Medication Dose Route Frequency Provider Last Rate Last Admin  . acetaminophen (TYLENOL) tablet 650 mg  650 mg Oral Q6H PRN Lenore Cordia, MD       Or  . acetaminophen (TYLENOL) suppository 650 mg  650 mg Rectal Q6H PRN Zada Finders R, MD      . enoxaparin (LOVENOX) injection 40 mg  40 mg Subcutaneous Q24H Zada Finders R, MD   40 mg at 07/13/19 0236  . ipratropium-albuterol (DUONEB) 0.5-2.5 (3) MG/3ML nebulizer solution 3 mL  3 mL Nebulization Q4H PRN Fritzi Mandes, MD   3 mL at 07/13/19 0743  . ipratropium-albuterol (DUONEB) 0.5-2.5 (3) MG/3ML nebulizer solution           . lactated ringers infusion   Intravenous Continuous Fritzi Mandes, MD 75  mL/hr at 07/13/19 0745 Rate Change at 07/13/19 0745  . ondansetron (ZOFRAN) tablet 4 mg  4 mg Oral Q6H PRN Lenore Cordia, MD       Or  . ondansetron (ZOFRAN) injection 4 mg  4 mg Intravenous Q6H PRN Zada Finders R, MD      . oxyCODONE (Oxy IR/ROXICODONE) immediate release tablet 5 mg  5 mg Oral Q6H PRN Lang Snow, NP   5 mg at 07/13/19 0556  . predniSONE (DELTASONE) tablet 40 mg  40 mg Oral BID WC Lenore Cordia, MD   40 mg at 07/13/19 0843  . sulfamethoxazole-trimethoprim (BACTRIM) 152.96 mg in dextrose 5 % 250 mL IVPB  15 mg/kg/day Intravenous Q6H Harvest Dark, MD 259.6 mL/hr at 07/13/19 3295 152.96 mg at 07/13/19 0607     Abtx:  Anti-infectives (From admission, onward)   Start     Dose/Rate Route Frequency Ordered Stop   07/13/19 0000  sulfamethoxazole-trimethoprim (BACTRIM) 152.96 mg in dextrose 5 % 250 mL IVPB     15 mg/kg/day  40.8 kg 259.6 mL/hr over 60 Minutes Intravenous Every 6 hours 07/12/19 2112     07/12/19 2045  ceFEPIme (MAXIPIME) 2 g in sodium chloride 0.9 % 100 mL IVPB     2 g 200 mL/hr over 30 Minutes Intravenous  Once 07/12/19 2039 07/12/19 2130   07/12/19 2045  metroNIDAZOLE (FLAGYL) IVPB 500 mg     500 mg 100 mL/hr over 60 Minutes Intravenous  Once 07/12/19 2039 07/12/19 2201   07/12/19 2045  vancomycin (VANCOCIN) IVPB 1000 mg/200 mL premix     1,000 mg 200 mL/hr over 60 Minutes Intravenous  Once 07/12/19 2039 07/12/19 2201      REVIEW OF SYSTEMS:  Const: negative fever, negative chills, ++ weight loss Eyes: negative diplopia or visual changes, negative eye pain ENT: negative coryza, negative sore throat Resp: ++ cough, no hemoptysis, ++dyspnea Cards: ++ chest pain, no palpitations, lower extremity edema GU: negative for frequency, dysuria and hematuria GI: Negative for abdominal pain, diarrhea, bleeding, constipation Skin: dry skin Heme: negative for easy bruising and gum/nose bleeding MS: generalize body ache and bone  pain' Neurolo:has dizziness  Psych:anxiety  Endocrine: negative for thyroid, diabetes Allergy/Immunology- negative for any medication or food allergies  Objective:  VITALS:  BP 114/74 (BP Location: Right Arm)   Pulse 89   Temp 97.8 F (36.6 C)   Resp 17   Ht _0  (1.803 m)   Wt 59 kg   SpO2 98%   BMI 18.13 kg/m  PHYSICAL EXAM:  General: Alert, cooperative, some resp  distress, thin and chronically ill  Head: Normocephalic, without obvious abnormality, atraumatic. Eyes: Conjunctivae clear, anicteric sclerae. Pupils are equal ENT Nares normal. No drainage or sinus tenderness. Lips, mucosa, and tongue normal. No Thrush Neck: Supple, symmetrical, no adenopathy, thyroid: non tender no carotid bruit and no JVD. Back: No CVA tenderness. Lungs: b/l air entry- crepts Heart: s1s2 tachycardia Abdomen: Soft, non-tender,not distended. Bowel sounds normal. No masses Extremities: atraumatic, no cyanosis. No edema. No clubbing Skin: No rashes or lesions. Or bruising Lymph: Cervical, supraclavicular normal. Neurologic: Grossly non-focal Pertinent Labs Lab Results CBC    Component Value Date/Time   WBC 3.7 (L) 07/13/2019 0524   RBC 3.34 (L) 07/13/2019 0524   HGB 9.7 (L) 07/13/2019 0524   HCT 29.9 (L) 07/13/2019 0524   PLT 220 07/13/2019 0524   MCV 89.5 07/13/2019 0524   MCH 29.0 07/13/2019 0524   MCHC 32.4 07/13/2019 0524   RDW 12.3 07/13/2019 0524   LYMPHSABS 0.9 07/12/2019 2047   MONOABS 0.6 07/12/2019 2047   EOSABS 0.0 07/12/2019 2047   BASOSABS 0.0 07/12/2019 2047    CMP Latest Ref Rng & Units 07/13/2019 07/12/2019 06/27/2019  Glucose 70 - 99 mg/dL 104(H) 126(H) 97  BUN 6 - 20 mg/dL _0 Creatinine 0.61 - 1.24 mg/dL 0.77 0.76 0.86  Sodium 135 - 145 mmol/L 135 135 135  Potassium 3.5 - 5.1 mmol/L 3.5 3.6 4.0  Chloride 98 - 111 mmol/L 104 102 102  CO2 22 - 32 mmol/L _1 Calcium 8.9 - 10.3 mg/dL 7.7(L) 8.4(L) 8.9  Total Protein 6.5 - 8.1 g/dL - 7.5 7.8  Total  Bilirubin 0.3 - 1.2 mg/dL - 0.7 0.7  Alkaline Phos 38 - 126 U/L - 101 85  AST 15 - 41 U/L - 40 31  ALT 0 - 44 U/L - 20 23      Microbiology: Recent Results (from the past 240 hour(s))  Blood Culture (routine x 2)     Status: None (Preliminary result)   Collection Time: 07/12/19  8:47 PM   Specimen: BLOOD  Result Value Ref Range Status   Specimen Description BLOOD BLOOD RIGHT ARM  Final   Special Requests   Final    BOTTLES DRAWN AEROBIC AND ANAEROBIC Blood Culture results may not be optimal due to an excessive volume of blood received in culture bottles   Culture   Final    NO GROWTH < 12 HOURS Performed at [facility], [address] [place], [place] [address]    Report Status PENDING  Incomplete  Blood Culture (routine x 2)     Status: None (Preliminary result)   Collection Time: 07/12/19  8:47 PM   Specimen: BLOOD  Result Value Ref Range Status   Specimen Description BLOOD LEFT ASSIST CONTROL  Final   Special Requests   Final    BOTTLES DRAWN AEROBIC AND ANAEROBIC Blood Culture adequate volume   Culture   Final    NO GROWTH < 12 HOURS Performed at [facility], [address] [place], [place] [address]    Report Status PENDING  Incomplete  Respiratory Panel by RT PCR (Flu A&B, Covid) - Nasopharyngeal Swab     Status: None   Collection Time: 07/13/19 12:06 AM   Specimen: Nasopharyngeal Swab  Result Value Ref Range Status   SARS Coronavirus 2 by RT PCR NEGATIVE NEGATIVE Final    Comment: (NOTE) SARS-CoV-2 target nucleic acids are NOT DETECTED. The SARS-CoV-2 RNA is generally detectable  in upper respiratoy specimens during the acute phase of infection. The lowest concentration of SARS-CoV-2 viral copies this assay can detect is 131 copies/mL. A negative result does not preclude SARS-Cov-2 infection and should not be used as the sole basis for treatment or other patient management decisions. A negative result may occur with  improper specimen  collection/handling, submission of specimen other than nasopharyngeal swab, presence of viral mutation(s) within the areas targeted by this assay, and inadequate number of viral copies (<131 copies/mL). A negative result must be combined with clinical observations, patient history, and epidemiological information. The expected result is Negative. Fact Sheet for Patients:  PinkCheek.be Fact Sheet for Healthcare Providers:  GravelBags.it This test is not yet ap proved or cleared by the [place] FDA and  has been authorized for detection and/or diagnosis of SARS-CoV-2 by FDA under an Emergency Use Authorization (EUA). This EUA will remain  in effect (meaning this test can be used) for the duration of the COVID-19 declaration under Section 564(b)(1) of the Act, 21 U.S.C. section 360bbb-3(b)(1), unless the authorization is terminated or revoked sooner.    Influenza A by PCR NEGATIVE NEGATIVE Final   Influenza B by PCR NEGATIVE NEGATIVE Final    Comment: (NOTE) The Xpert Xpress SARS-CoV-2/FLU/RSV assay is intended as an aid in  the diagnosis of influenza from Nasopharyngeal swab specimens and  should not be used as a sole basis for treatment. Nasal washings and  aspirates are unacceptable for Xpert Xpress SARS-CoV-2/FLU/RSV  testing. Fact Sheet for Patients: PinkCheek.be Fact Sheet for Healthcare Providers: GravelBags.it This test is not yet approved or cleared by the [place] FDA and  has been authorized for detection and/or diagnosis of SARS-CoV-2 by  FDA under an Emergency Use Authorization (EUA). This EUA will remain  in effect (meaning this test can be used) for the duration of the  Covid-19 declaration under Section 564(b)(1) of the Act, 21  U.S.C. section 360bbb-3(b)(1), unless the authorization is  terminated or revoked. Performed at [facility]  [address] [place], [place] [address]     IMAGING RESULTS:  I have personally reviewed the films ?Diffuse bilateral ground-glass airspace disease, with upper lobe predominant cystic changes as above. If the patient is immunocompromised, atypical infections such as pneumocystis could give this pattern. Hypersensitivity pneumonitis, drug toxicity, or nonspecific interstitial pneumonitis could give a similar pattern.   Impression/Recommendation ?29 yr male with h/o sob of a few weeks duration but worsening, weight loss, newly diagnosed HIV  Hyypoxic respiratory failure with GGO lungs   in a patient with HIV/AIDS- This is PCP/PJP unless proven otherwise D.D  KS  CMV ?the above are less likely ? Will get beta D glucan/LDH Continue IV bactrim and prednisone which he will need for 21 days.  AIDS- early initiation of ART in PCP is favored rather than delaying it because of increased mortality risk if delayed. Currently  There is no evidence clinically of any infections like cryptococcus/toxo of the brain/TB when ART initiation would be delayed. Will do additional labs to check the above and may start HAART in a week or 5 days  Cocaine use- discussed about not using it, nor smoking cigarettes  or vaping because of risk of lung injury  Anemia of chronic disease No other features  to suggest MAC or parvo infection  Malnutrition- recommend nutrition consult  Resolved candidiasis- watch closely as with prednisone it will flare up ___________________________________________________ Discussed with patient,and his partner.

## 2019-07-13 NOTE — Progress Notes (Signed)
[facility]  - [place] [facility]   PATIENT NAME: Sean Ayers    MR#:  [id number]  DATE OF BIRTH:  1991-02-21  SUBJECTIVE:  patient came in with increasing shortness of breath, heartburn's, fatigue, poor appetite and weight loss. He was recently diagnosed with HIV in February. Appears somewhat anxious, hyperventilating earlier REVIEW OF SYSTEMS:   Review of Systems  Constitutional: Negative for chills, fever and weight loss.  HENT: Negative for ear discharge, ear pain and nosebleeds.   Eyes: Negative for blurred vision, pain and discharge.  Respiratory: Negative for sputum production, shortness of breath, wheezing and stridor.   Cardiovascular: Negative for chest pain, palpitations, orthopnea and PND.  Gastrointestinal: Negative for abdominal pain, diarrhea, nausea and vomiting.  Genitourinary: Negative for frequency and urgency.  Musculoskeletal: Negative for back pain and joint pain.  Neurological: Negative for sensory change, speech change, focal weakness and weakness.  Psychiatric/Behavioral: Negative for depression and hallucinations. The patient is not nervous/anxious.    Tolerating Diet:yes Tolerating PT:   DRUG ALLERGIES:  No Known Allergies  VITALS:  Blood pressure 114/74, pulse 89, temperature 97.8 F (36.6 C), resp. rate 17, height 5\' 11"  (1.803 m), weight 59 kg, SpO2 98 %.  PHYSICAL EXAMINATION:   Physical Exam  GENERAL:  29 y.o.-year-old patient lying in the bed with mild to modrate acute distress.  Patient is thin, cachectic, anxious EYES: Pupils equal, round, reactive to light and accommodation. No scleral icterus.   HEENT: Head atraumatic, normocephalic. Oropharynx and nasopharynx clear. No oral thrush noted NECK:  Supple, no jugular venous distention. No thyroid enlargement, no tenderness.  LUNGS: coarse breath sounds bilaterally, no wheezing, rales, rhonchi. No use of accessory muscles of respiration.  CARDIOVASCULAR: S1, S2 normal.  No murmurs, rubs, or gallops. tachycardia ABDOMEN: Soft, nontender, nondistended. Bowel sounds present. No organomegaly or mass.  EXTREMITIES: No cyanosis, clubbing or edema b/l.    NEUROLOGIC: Cranial nerves II through XII are intact. No focal Motor or sensory deficits b/l.   PSYCHIATRIC:  patient is alert and oriented x 3.  SKIN: No obvious rash, lesion, or ulcer. Tattoo+  LABORATORY PANEL:  CBC Recent Labs  Lab 07/13/19 0524  WBC 3.7*  HGB 9.7*  HCT 29.9*  PLT 220    Chemistries  Recent Labs  Lab 07/12/19 2047 07/12/19 2047 07/13/19 0524  NA 135   < > 135  K 3.6   < > 3.5  CL 102   < > 104  CO2 24   < > 25  GLUCOSE 126*   < > 104*  BUN 12   < > 10  CREATININE 0.76   < > 0.77  CALCIUM 8.4*   < > 7.7*  MG  --   --  2.0  AST 40  --   --   ALT 20  --   --   ALKPHOS 101  --   --   BILITOT 0.7  --   --    < > = values in this interval not displayed.   Cardiac Enzymes No results for input(s): TROPONINI in the last 168 hours. RADIOLOGY:  CT Angio Chest PE W and/or Wo Contrast  Result Date: 07/12/2019 CLINICAL DATA:  Worsening shortness of breath, tachycardia, tachypnea EXAM: CT ANGIOGRAPHY CHEST WITH CONTRAST TECHNIQUE: Multidetector CT imaging of the chest was performed using the standard protocol during bolus administration of intravenous contrast. Multiplanar CT image reconstructions and MIPs were obtained to evaluate the vascular anatomy. CONTRAST:  67mL OMNIPAQUE  IOHEXOL 350 MG/ML SOLN COMPARISON:  07/12/2019 FINDINGS: Cardiovascular: This is a technically adequate evaluation of the pulmonary vasculature. No filling defects or pulmonary emboli. Heart is unremarkable without pericardial effusion. Thoracic aorta is unremarkable without aneurysm or dissection. Mediastinum/Nodes: No enlarged mediastinal, hilar, or axillary lymph nodes. Thyroid gland, trachea, and esophagus demonstrate no significant findings. Lungs/Pleura: Diffuse ground-glass airspace disease is seen  throughout the lungs, without lobar predilection. Denser consolidation at the left lung base may reflect superimposed atelectasis or dense airspace disease. Cystic changes are seen within the upper lung zones. No effusion or pneumothorax. The central airways are patent. Upper Abdomen: Limited imaging through the upper abdomen is unremarkable. Musculoskeletal: There are no acute or destructive bony lesions. Reconstructed images demonstrate no additional findings. Review of the MIP images confirms the above findings. IMPRESSION: 1. No evidence of pulmonary embolus. 2. Diffuse bilateral ground-glass airspace disease, with upper lobe predominant cystic changes as above. If the patient is immunocompromised, atypical infections such as pneumocystis could give this pattern. Hypersensitivity pneumonitis, drug toxicity, or nonspecific interstitial pneumonitis could give a similar pattern. 3. Dense left lower lobe consolidation which could reflect atelectasis or superimposed bronchopneumonia. Electronically Signed   By: Randa Ngo M.D.   On: 07/12/2019 22:32   DG Chest Port 1 View  Result Date: 07/12/2019 CLINICAL DATA:  Shortness of breath EXAM: PORTABLE CHEST 1 VIEW COMPARISON:  06/27/2019 FINDINGS: Worsened bilateral hazy and ground-glass opacities left greater than right. Normal heart size. No pleural effusion or pneumothorax. IMPRESSION: Interval worsening of left greater than right hazy and ground-glass opacities, possibly due to pneumonia, to include atypical etiologies. Electronically Signed   By: Donavan Foil M.D.   On: 07/12/2019 20:54   ASSESSMENT AND PLAN:  Sean Ayers is a 29 y.o. male with medical history significant for appendicitis s/p appendectomy 01/22/2017 and recently diagnosed HIV who is admitted with acute respiratory failure with hypoxia due to suspected pneumocystis pneumonia.  Acute respiratory failure with hypoxia due to suspected due to opportunitistic infection/ ? pneumocystis  pneumonia: -Patient with significant hypoxia on arrival initially requiring nonrebreather at 15 L now weaned down to 10 L High flow [place]  supplemental O2 . - CTA chest shows diffuse bilateral groundglass airspace disease with upper lobe predominant cystic changes. -ID and Pulmonary conuslted -Continue IV Bactrim 15 mg/kg/day -on  prednisone 40 mg twice daily  -Continue supplemental oxygen and wean down as able -varicella zoster PCR, CMV DNA, serum fungitell, KOH Prep, pneumocystis PCR -PRN breathing treatments  Newly diagnosed HIV: HIV antibody positive 06/27/2019 -differentiation showed HIV 1 antibody positive, HIV 2 antibody negative.  Patient found out about positive test day before admission. -CD4, HIV RNA pending -Check hepatitis B and C serologies -Infectious disease to follow  Esophageal candidiasis-- recently diagnosed -patient was given prescription for Diflucan -currently I do not see any evidence of oral thrush.  DVT prophylaxis: Lovenox Code Status: Full code Family Communication: Patient has discussed with his significant other Joshua Disposition Plan: From home, disposition pending further evaluation and management of acute respiratory failure with hypoxia requiring high Fio2 and infectious disease consultation for likely initiation of antiretroviral therapy. Consults called: Infectious disease, Pulmonary consult   TOTAL TIME TAKING CARE OF THIS PATIENT: *35* minutes.  >50% time spent on counselling and coordination of care  Note: This dictation was prepared with Dragon dictation along with smaller phrase technology. Any transcriptional errors that result from this process are unintentional.  Fritzi Mandes M.D    [facility]   CC:  Primary care physician; Patient, No Pcp PerPatient ID: Hewitt Shorts, male   DOB: 1990-05-29, 29 y.o.   MRN: [id number]

## 2019-07-13 NOTE — Progress Notes (Signed)
Initial Nutrition Assessment  DOCUMENTATION CODES:   Underweight  INTERVENTION:   Ensure Enlive po TID, each supplement provides 350 kcal and 20 grams of protein  Magic cup TID with meals, each supplement provides 290 kcal and 9 grams of protein  MVI daily   Pt is at high refeed risk; recommend monitor K, Mg and P labs daily as oral intake improves  NUTRITION DIAGNOSIS:   Increased nutrient needs related to chronic illness(HIV) as evidenced by increased estimated needs.  GOAL:   Patient will meet greater than or equal to 90% of their needs  MONITOR:   PO intake, Supplement acceptance, Labs, Weight trends, Skin, I & O's  REASON FOR ASSESSMENT:   Malnutrition Screening Tool    ASSESSMENT:   29 y.o. male with medical history significant for appendicitis s/p appendectomy 01/22/2017 and recently diagnosed HIV who presents to the ED for evaluation of dyspnea and found to have PNA  RD working remotely.  Pt reports poor appetite and oral intake pta. Pt with recent diagnoses of thrush. Pt also reports a 30lb recent weight loss. Per chart, there is no recent weight history documented but patient has lost 45lbs(25%) in <14 months which is significant. RD will add supplements and MVI to help pt meet his estimated needs. Suspect pt is at high refeed risk; recommend monitor electrolytes.   Pt likely with malnutrition but unable to diagnose at this time as NFPE cannot be performed.    Medications reviewed and include: lovenox, prednisone, bactrim  Labs reviewed: K 3.5 wnl, P 4.5 wnl, M 2.0 wnl Wbc- 3.7(L), Hgb 9.7(L), Hct 29.9(L)  Unable to complete Nutrition-Focused physical exam at this time.   Diet Order:   Diet Order            Diet regular Room service appropriate? Yes; Fluid consistency: Thin  Diet effective now             EDUCATION NEEDS:   No education needs have been identified at this time  Skin:  Skin Assessment: Reviewed RN Assessment  Last BM:   PTA  Height:   Ht Readings from Last 1 Encounters:  07/13/19 5\' 11"  (1.803 m)    Weight:   Wt Readings from Last 1 Encounters:  07/13/19 59 kg    Ideal Body Weight:  78.2 kg  BMI:  Body mass index is 18.13 kg/m.  Estimated Nutritional Needs:   Kcal:  2100-2400kcal/day  Protein:  105-120g/day  Fluid:  >1.8L/day  07/15/19 MS, RD, LDN Contact information available in Amion

## 2019-07-13 NOTE — Progress Notes (Signed)
In to give bedside report with night RN.  Patient on 3L [place] appearing to have difficulty breathing.  O2 sats in low 80s.  Patient alert. Dr. Allena Katz made aware and ordered neb treatments.  Respiratory in at bedside.  Patient given neb treat.  Once neb treatment stopped, patient sat down to 70s. Placed on nonrebreather and O2 levels to 100% and patient appeared more comfortable.  Dr. Allena Katz at bedside.  Patient currently on 10L high flow and appears to be more comfortable with sat at 100%.

## 2019-07-13 NOTE — Consult Note (Signed)
Pulmonary Medicine          Date: 07/13/2019,   MRN# [id number] Sean Ayers 10/13/1990     AdmissionWeight: 40.8 kg                 CurrentWeight: 59 kg      CHIEF COMPLAINT:   Bilateral opportunistic atypical pneumonia   HISTORY OF PRESENT ILLNESS   Sean Ayers is a 29 y.o. male with medical history significant for appendicitis s/p appendectomy 01/22/2017 and recently diagnosed HIV who presents to the ED for evaluation of dyspnea.  Patient reports approximately 3 months of progressive shortness of breath, intermittent fevers and diaphoresis, 30 pound weight loss, poor oral intake, and cough occasionally productive of yellow sputum.  He was seen in the ED on 06/27/2019 and was noted to have oral candidiasis with suspicion for esophageal candidiasis.  He was given fluconazole.  2 view chest x-ray showed new faint bilateral pulmonary infiltrates.  An HIV antibody test was obtained and ultimately resulted positive.  [facility] attempted to contact patient without success.  Patient states that he was contacted yesterday and notified of his positive HIV test result.  He reports a history of tobacco use, was smoking about 1 pack/day since age 48 but states he has since quit smoking.  He reports occasional marijuana use.  He denies any cocaine or IV drug use.  He states he is currently not taking any medications other than Tylenol as needed. Initial vitals showed BP 120/92, pulse 106, RR 33, temp 100.0 Fahrenheit, SPO2 62% on room air.  Patient was placed on NRB at 15 L with SPO2 100% room air and subsequently weaned down to 4 L supplemental O2 via [place].   PAST MEDICAL HISTORY   Past Medical History:  Diagnosis Date  . Acute appendicitis   . Closed fracture of medial malleolus of right ankle 04/22/2018     SURGICAL HISTORY   Past Surgical History:  Procedure Laterality Date  . LAPAROSCOPIC APPENDECTOMY N/A 01/22/2017   Procedure: APPENDECTOMY LAPAROSCOPIC;  Surgeon: Clayburn Pert, MD;  Location: [facility] ORS;  Service: General;  Laterality: N/A;     FAMILY HISTORY   Family History  Problem Relation Age of Onset  . Healthy Mother   . Healthy Father   . Brain cancer Other      SOCIAL HISTORY   Social History   Tobacco Use  . Smoking status: Current Every Day Smoker    Packs/day: 0.10    Types: Cigarettes  . Smokeless tobacco: Never Used  Substance Use Topics  . Alcohol use: No  . Drug use: No     MEDICATIONS    Home Medication:    Current Medication:  Current Facility-Administered Medications:  .  acetaminophen (TYLENOL) tablet 650 mg, 650 mg, Oral, Q6H PRN **OR** acetaminophen (TYLENOL) suppository 650 mg, 650 mg, Rectal, Q6H PRN, Posey Pronto, Vishal R, MD .  enoxaparin (LOVENOX) injection 40 mg, 40 mg, Subcutaneous, Q24H, Zada Finders R, MD, 40 mg at 07/13/19 0236 .  ipratropium-albuterol (DUONEB) 0.5-2.5 (3) MG/3ML nebulizer solution 3 mL, 3 mL, Nebulization, Q4H PRN, Fritzi Mandes, MD, 3 mL at 07/13/19 0743 .  ipratropium-albuterol (DUONEB) 0.5-2.5 (3) MG/3ML nebulizer solution, , , ,  .  ondansetron (ZOFRAN) tablet 4 mg, 4 mg, Oral, Q6H PRN **OR** ondansetron (ZOFRAN) injection 4 mg, 4 mg, Intravenous, Q6H PRN, Patel, Vishal R, MD .  oxyCODONE (Oxy IR/ROXICODONE) immediate release tablet 5 mg, 5 mg, Oral, Q6H PRN, Stark Klein, Bing Neighbors, NP,  5 mg at 07/13/19 0556 .  predniSONE (DELTASONE) tablet 40 mg, 40 mg, Oral, BID WC, Darreld Mclean R, MD, 40 mg at 07/13/19 0843 .  sulfamethoxazole-trimethoprim (BACTRIM) 152.96 mg in dextrose 5 % 250 mL IVPB, 15 mg/kg/day, Intravenous, Q6H, Minna Antis, MD, Last Rate: 259.6 mL/hr at 07/13/19 0607, 152.96 mg at 07/13/19 1610    ALLERGIES   Patient has no known allergies.     REVIEW OF SYSTEMS    Review of Systems:  Gen:  Denies  fever, sweats, chills weigh loss  HEENT: Denies blurred vision, double vision, ear pain, eye pain, hearing loss, nose bleeds, sore throat Cardiac:  No  dizziness, chest pain or heaviness, chest tightness,edema Resp:   Denies cough or sputum porduction, shortness of breath,wheezing, hemoptysis,  Gi: Denies swallowing difficulty, stomach pain, nausea or vomiting, diarrhea, constipation, bowel incontinence Gu:  Denies bladder incontinence, burning urine Ext:   Denies Joint pain, stiffness or swelling Skin: Denies  skin rash, easy bruising or bleeding or hives Endoc:  Denies polyuria, polydipsia , polyphagia or weight change Psych:   Denies depression, insomnia or hallucinations   Other:  All other systems negative   VS: BP 114/74 (BP Location: Right Arm)   Pulse 89   Temp 97.8 F (36.6 C)   Resp 17   Ht 5\' 11"  (1.803 m)   Wt 59 kg   SpO2 98%   BMI 18.13 kg/m      PHYSICAL EXAM    GENERAL:NAD, no fevers, chills, no weakness no fatigue HEAD: Normocephalic, atraumatic.  EYES: Pupils equal, round, reactive to light. Extraocular muscles intact. No scleral icterus.  MOUTH: Moist mucosal membrane. Dentition intact. No abscess noted.  EAR, NOSE, THROAT: Clear without exudates. No external lesions.  NECK: Supple. No thyromegaly. No nodules. No JVD.  PULMONARY: Diffuse coarse rhonchi right sided +wheezes CARDIOVASCULAR: S1 and S2. Regular rate and rhythm. No murmurs, rubs, or gallops. No edema. Pedal pulses 2+ bilaterally.  GASTROINTESTINAL: Soft, nontender, nondistended. No masses. Positive bowel sounds. No hepatosplenomegaly.  MUSCULOSKELETAL: No swelling, clubbing, or edema. Range of motion full in all extremities.  NEUROLOGIC: Cranial nerves II through XII are intact. No gross focal neurological deficits. Sensation intact. Reflexes intact.  SKIN: No ulceration, lesions, rashes, or cyanosis. Skin warm and dry. Turgor intact.  PSYCHIATRIC: Mood, affect within normal limits. The patient is awake, alert and oriented x 3. Insight, judgment intact.       IMAGING    DG Chest 2 View  Result Date: 06/27/2019 CLINICAL DATA:  Chest  pain. Nausea and vomiting. Shortness of breath. EXAM: CHEST - 2 VIEW COMPARISON:  03/18/2018 FINDINGS: There is new diffuse haziness in both lungs primary in the mid zones but also in the left lung base adjacent to the left heart border on the PA view. Heart size and vascularity are normal. No effusions. No bone abnormality. IMPRESSION: New faint bilateral pulmonary infiltrates. Electronically Signed   By: 03/20/2018 M.D.   On: 06/27/2019 13:12   CT Angio Chest PE W and/or Wo Contrast  Result Date: 07/12/2019 CLINICAL DATA:  Worsening shortness of breath, tachycardia, tachypnea EXAM: CT ANGIOGRAPHY CHEST WITH CONTRAST TECHNIQUE: Multidetector CT imaging of the chest was performed using the standard protocol during bolus administration of intravenous contrast. Multiplanar CT image reconstructions and MIPs were obtained to evaluate the vascular anatomy. CONTRAST:  54mL OMNIPAQUE IOHEXOL 350 MG/ML SOLN COMPARISON:  07/12/2019 FINDINGS: Cardiovascular: This is a technically adequate evaluation of the pulmonary vasculature. No filling defects or  pulmonary emboli. Heart is unremarkable without pericardial effusion. Thoracic aorta is unremarkable without aneurysm or dissection. Mediastinum/Nodes: No enlarged mediastinal, hilar, or axillary lymph nodes. Thyroid gland, trachea, and esophagus demonstrate no significant findings. Lungs/Pleura: Diffuse ground-glass airspace disease is seen throughout the lungs, without lobar predilection. Denser consolidation at the left lung base may reflect superimposed atelectasis or dense airspace disease. Cystic changes are seen within the upper lung zones. No effusion or pneumothorax. The central airways are patent. Upper Abdomen: Limited imaging through the upper abdomen is unremarkable. Musculoskeletal: There are no acute or destructive bony lesions. Reconstructed images demonstrate no additional findings. Review of the MIP images confirms the above findings. IMPRESSION: 1. No  evidence of pulmonary embolus. 2. Diffuse bilateral ground-glass airspace disease, with upper lobe predominant cystic changes as above. If the patient is immunocompromised, atypical infections such as pneumocystis could give this pattern. Hypersensitivity pneumonitis, drug toxicity, or nonspecific interstitial pneumonitis could give a similar pattern. 3. Dense left lower lobe consolidation which could reflect atelectasis or superimposed bronchopneumonia. Electronically Signed   By: Sharlet Salina M.D.   On: 07/12/2019 22:32   DG Chest Port 1 View  Result Date: 07/12/2019 CLINICAL DATA:  Shortness of breath EXAM: PORTABLE CHEST 1 VIEW COMPARISON:  06/27/2019 FINDINGS: Worsened bilateral hazy and ground-glass opacities left greater than right. Normal heart size. No pleural effusion or pneumothorax. IMPRESSION: Interval worsening of left greater than right hazy and ground-glass opacities, possibly due to pneumonia, to include atypical etiologies. Electronically Signed   By: Jasmine Pang M.D.   On: 07/12/2019 20:54      ASSESSMENT/PLAN   Acute hypoxemic respiratory failure  - due to bilateral most likely opportunistic consolidated atypical pneumonia  - patient with cachexia likely due to HIV/AIDS - CD4 counts pending   - suspect PJP pneumonia - will obtain ABG -patient already on steroids  - pneumocystis PRC via sputum may need BAL - patient currently on bactrim IV per ID  -CMV PCR -torch IgM -KOH prep for sputum -serum fungitell -sputum bacterial cultures x 3 -VZV pcr -RVP without droplet precautions -blood cultures in process -Incentive spirometry -albuterol MDI -ID on case appreciate input     Thank you for allowing me to participate in the care of this patient.   Patient/Family are satisfied with care plan and all questions have been answered.  This document was prepared using Dragon voice recognition software and may include unintentional dictation errors.     Vida Rigger,  M.D.  Division of Pulmonary & Critical Care Medicine  [facility]

## 2019-07-14 LAB — HELPER T-LYMPH-CD4 (ARMC ONLY)
% CD 4 Pos. Lymph.: 4.2 % — ABNORMAL LOW (ref 30.8–58.5)
Absolute CD 4 Helper: 17 /uL — ABNORMAL LOW (ref 359–1519)
Basophils Absolute: 0 10*3/uL (ref 0.0–0.2)
Basos: 0 %
EOS (ABSOLUTE): 0 10*3/uL (ref 0.0–0.4)
Eos: 0 %
Hematocrit: 29.5 % — ABNORMAL LOW (ref 37.5–51.0)
Hemoglobin: 9.8 g/dL — ABNORMAL LOW (ref 13.0–17.7)
Immature Grans (Abs): 0 10*3/uL (ref 0.0–0.1)
Immature Granulocytes: 1 %
Lymphocytes Absolute: 0.4 10*3/uL — ABNORMAL LOW (ref 0.7–3.1)
Lymphs: 11 %
MCH: 30 pg (ref 26.6–33.0)
MCHC: 33.2 g/dL (ref 31.5–35.7)
MCV: 90 fL (ref 79–97)
Monocytes Absolute: 0.4 10*3/uL (ref 0.1–0.9)
Monocytes: 12 %
Neutrophils Absolute: 2.7 10*3/uL (ref 1.4–7.0)
Neutrophils: 76 %
Platelets: 216 10*3/uL (ref 150–450)
RBC: 3.27 x10E6/uL — ABNORMAL LOW (ref 4.14–5.80)
RDW: 11.7 % (ref 11.6–15.4)
WBC: 3.6 10*3/uL (ref 3.4–10.8)

## 2019-07-14 LAB — HEPATITIS B SURFACE ANTIBODY, QUANTITATIVE: Hep B S AB Quant (Post): 23.8 m[IU]/mL (ref >9.9)

## 2019-07-14 LAB — HCV INTERPRETATION

## 2019-07-14 LAB — EXPECTORATED SPUTUM ASSESSMENT W GRAM STAIN, RFLX TO RESP C

## 2019-07-14 LAB — HIV-1 RNA QUANT-NO REFLEX-BLD
HIV 1 RNA Quant: 160000 copies/mL
LOG10 HIV-1 RNA: 5.204 log10copy/mL

## 2019-07-14 LAB — RPR: RPR Ser Ql: NONREACTIVE

## 2019-07-14 LAB — URINE CULTURE: Culture: NO GROWTH

## 2019-07-14 LAB — HCV AB W REFLEX TO QUANT PCR: HCV Ab: <0.1 s/co ratio (ref 0.0–0.9)

## 2019-07-14 MED ORDER — LORAZEPAM 2 MG/ML IJ SOLN
1.0000 mg | Freq: Once | INTRAMUSCULAR | Status: AC
  Administered 2019-07-14: 05:00:00 1 mg via INTRAVENOUS
  Filled 2019-07-14: qty 1

## 2019-07-14 MED ORDER — TRAMADOL HCL 50 MG PO TABS
50.0000 mg | ORAL_TABLET | Freq: Four times a day (QID) | ORAL | Status: DC | PRN
  Administered 2019-07-14 – 2019-07-15 (×2): 50 mg via ORAL
  Filled 2019-07-14 (×2): qty 1

## 2019-07-14 MED ORDER — GUAIFENESIN-DM 100-10 MG/5ML PO SYRP
5.0000 mL | ORAL_SOLUTION | ORAL | Status: DC | PRN
  Administered 2019-07-14 (×2): 5 mL via ORAL
  Filled 2019-07-14 (×2): qty 5

## 2019-07-14 MED ORDER — ALPRAZOLAM 0.25 MG PO TABS
0.2500 mg | ORAL_TABLET | Freq: Two times a day (BID) | ORAL | Status: DC | PRN
  Administered 2019-07-14 – 2019-07-16 (×3): 0.25 mg via ORAL
  Filled 2019-07-14 (×3): qty 1

## 2019-07-14 MED ORDER — POLYETHYLENE GLYCOL 3350 17 G PO PACK
17.0000 g | PACK | Freq: Every day | ORAL | Status: DC | PRN
  Administered 2019-07-15: 17 g via ORAL
  Filled 2019-07-14: qty 1

## 2019-07-14 NOTE — Progress Notes (Signed)
ID Pt sleeping- partner at bedside Patient Vitals for the past 24 hrs:  BP Temp Temp src Pulse Resp SpO2 Weight  07/14/19 1625 100/61 -- -- 83 18 96 % --  07/14/19 1114 103/69 -- -- 91 18 93 % --  07/14/19 0801 (!) 105/58 97.7 F (36.5 C) -- 83 18 94 % --  07/14/19 0700 -- -- -- -- -- -- 58.8 kg  07/14/19 0646 -- -- -- -- -- -- 58.8 kg  07/14/19 0509 105/66 97.6 F (36.4 C) Axillary 72 19 100 % --  07/13/19 2055 -- -- -- -- -- 99 % --  07/13/19 2050 105/67 98 F (36.7 C) Oral 83 19 99 % --     CBC Latest Ref Rng & Units 07/13/2019 07/13/2019 07/12/2019  WBC 3.4 - 10.8 x10E3/uL 3.6 3.7(L) 12.4(H)  Hemoglobin 13.0 - 17.7 g/dL 0.8(X) 4.4(Y) 11.7(L)  Hematocrit 37.5 - 51.0 % 29.9(L) 29.5(L) 34.9(L)  Platelets 150 - 450 x10E3/uL 220 216 255    CMP Latest Ref Rng & Units 07/13/2019 07/12/2019 06/27/2019  Glucose 70 - 99 mg/dL 185(U) 314(H) 97  BUN 6 - 20 mg/dL 10 12 9   Creatinine 0.61 - 1.24 mg/dL 7.02 6.37  Sodium 135 - 145 mmol/L 135 135 135  Potassium 3.5 - 5.1 mmol/L 3.5 3.6 4.0  Chloride 98 - 111 mmol/L 104 102 102  CO2 22 - 32 mmol/L 25 24 26   Calcium 8.9 - 10.3 mg/dL 7.7(L) 8.4(L) 8.9  Total Protein 6.5 - 8.1 g/dL - 7.5 7.8  Total Bilirubin 0.3 - 1.2 mg/dL - 0.7 0.7  Alkaline Phos 38 - 126 U/L - 101 85  AST 15 - 41 U/L - 40 31  ALT 0 - 44 U/L - 20 23    Impression/recommendation  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  HAART will be started as OP once he has RW

## 2019-07-14 NOTE — Plan of Care (Signed)
  Problem: Education: Goal: Knowledge of General Education information will improve Description: Including pain rating scale, medication(s)/side effects and non-pharmacologic comfort measures Outcome: Progressing   Problem: Health Behavior/Discharge Planning: Goal: Ability to manage health-related needs will improve Outcome: Progressing   Problem: Clinical Measurements: Goal: Ability to maintain clinical measurements within normal limits will improve Outcome: Progressing Goal: Will remain free from infection Outcome: Progressing Goal: Diagnostic test results will improve Outcome: Progressing Goal: Respiratory complications will improve Outcome: Progressing Goal: Cardiovascular complication will be avoided Outcome: Progressing   Problem: Coping: Goal: Level of anxiety will decrease Outcome: Progressing   Problem: Pain Managment: Goal: General experience of comfort will improve Outcome: Progressing   Problem: Safety: Goal: Ability to remain free from injury will improve Outcome: Progressing   

## 2019-07-14 NOTE — Progress Notes (Signed)
Pt coughing, SOB, very anxious on 5L hi flow.  Pt put on nonrebreather at 15L, oxygen saturation 94-99%.  Hospitalist paged and new orders received.

## 2019-07-14 NOTE — Progress Notes (Signed)
   07/14/19 1130  Clinical Encounter Type  Visited With Patient;Other (Comment)  Visit Type Initial  Referral From Chaplain  Consult/Referral To Chaplain  Chaplain visited with patient after speaking with his boyfriend Chaplain offered pastoral presence, empathy, and prayer. After prayer, Chaplain notice tears in patient's eyes and asked him about tears. Patient said, "noone has prayed for me in a long time." Chaplain reminded him of how important he is.

## 2019-07-14 NOTE — Progress Notes (Signed)
   07/14/19 1000  Clinical Encounter Type  Visited With Other (Comment)  Visit Type Initial;Spiritual support;Social support  Referral From Chaplain  Consult/Referral To Chaplain  Chaplain saw Sharia Reeve, patient's boyfriend sobbing while speaking doctor and nurse. Doctor finished what she was saying and pointed to Chaplain. Nurse left and Chaplain and Sharia Reeve spoke extensively. Sharia Reeve is concerned about Claudio's health. [name] said that he has the support of his mema, who is a Curator and who tells him about the Bible and the ways of God. Sharia Reeve is hopeful that Lavar will recover and be okay. Chaplain offered pastoral presence, empathy, and prayer.

## 2019-07-14 NOTE — Plan of Care (Signed)
  Problem: Education: Goal: Knowledge of General Education information will improve Description: Including pain rating scale, medication(s)/side effects and non-pharmacologic comfort measures Outcome: Progressing   Problem: Health Behavior/Discharge Planning: Goal: Ability to manage health-related needs will improve Outcome: Progressing   Problem: Clinical Measurements: Goal: Ability to maintain clinical measurements within normal limits will improve Outcome: Progressing Goal: Will remain free from infection Outcome: Progressing Goal: Diagnostic test results will improve Outcome: Progressing Goal: Respiratory complications will improve Outcome: Progressing Goal: Cardiovascular complication will be avoided Outcome: Progressing   Problem: Coping: Goal: Level of anxiety will decrease Outcome: Progressing   Problem: Pain Managment: Goal: General experience of comfort will improve Outcome: Progressing   

## 2019-07-14 NOTE — TOC Progression Note (Signed)
Transition of Care [facility]) - Progression Note    Patient Details  Name: Sean Ayers MRN: [id number] Date of Birth: 08/22/1990  Transition of [facility]) CM/SW Contact  Shawn Route, RN Phone Number: 07/14/2019, 2:18 PM  Clinical Narrative:     Follow up note.  Patient does not need to be set up with [facility].  Earlier this week he has been enrolled in a clinic that provides PCP and Inf Disease provider as well as assistance with HIV and HIV related medications.  This program is waiting on the approval for the medications,   At discharge.  His medications will need to be sent to different pharmacies.  Waiting to receive list of medications covered for program and then other medication will be sent to Medication Management.   Expected Discharge Plan: Home/Self Care Barriers to Discharge: Continued Medical Work up  Expected Discharge Plan and Services Expected Discharge Plan: Home/Self Care   Discharge Planning Services: CM Consult, Other - See comment([name], [facility], [facility].)   Living arrangements for the past 2 months: Single Family Home                                       Social Determinants of Health (SDOH) Interventions    Readmission Risk Interventions No flowsheet data found.

## 2019-07-14 NOTE — Progress Notes (Signed)
Currently, pt on nonrebreather 15L and relaxed after ativan given.  No sign of distress at this time.  O2 sat 99%.

## 2019-07-14 NOTE — TOC Initial Note (Signed)
Transition of Care [name]) - Initial/Assessment Note    Patient Details  Name: Sean Ayers MRN: [id number] Date of Birth: 1991-01-18  Transition of Care [name]) CM/SW Contact:    Sean Dike, RN Phone Number: 07/14/2019, 1:55 PM  Clinical Narrative:                  Met with patient in room.  He lives in a single family home with his boyfriend Sean Ayers.  He has no family support.  He recently lost his job and has no insurance.  I have spoken with Sean Ayers at [employer] regarding possible oxygen need for insurance.  Sean Ayers reports he will need to put a debit card on file to receive this.  Patient is agreeable to this.  Referred patient to [facility] for PCP to be established.  Patient will be referred to Medication Management at for prescriptions, except those related to HIV treatment.  I have referred this patient to Sean Ayers with [name] to help get patient connected for resources and treatment for HIV.  She will be following up with patient this afternoon. Will continue to follow patient for new needs.  Expected Discharge Plan: Home/Self Care Barriers to Discharge: Continued Medical Work up   Patient Goals and CMS Choice        Expected Discharge Plan and Services Expected Discharge Plan: Home/Self Care   Discharge Planning Services: CM Consult, Other - See comment([name], [facility], [facility].)   Living arrangements for the past 2 months: Single Family Home                                      Prior Living Arrangements/Services Living arrangements for the past 2 months: Single Family Home Lives with:: Significant Other Patient language and need for interpreter reviewed:: Yes Do you feel safe going back to the place where you live?: Yes      Need for Family Participation in Patient Care: No (Comment) Care giver support system in place?: Yes (comment)   Criminal Activity/Legal Involvement Pertinent to Current  Situation/Hospitalization: No - Comment as needed  Activities of Daily Living Home Assistive Devices/Equipment: None ADL Screening (condition at time of admission) Patient's cognitive ability adequate to safely complete daily activities?: Yes Is the patient deaf or have difficulty hearing?: No Does the patient have difficulty seeing, even when wearing glasses/contacts?: No Does the patient have difficulty concentrating, remembering, or making decisions?: No Patient able to express need for assistance with ADLs?: Yes Does the patient have difficulty dressing or bathing?: No Independently performs ADLs?: Yes (appropriate for developmental age) Does the patient have difficulty walking or climbing stairs?: No Weakness of Legs: Both Weakness of Arms/Hands: Both  Permission Sought/Granted                  Emotional Assessment Appearance:: Appears stated age Attitude/Demeanor/Rapport: Engaged Affect (typically observed): Accepting, Appropriate Orientation: : Oriented to Self, Oriented to Place, Oriented to  Time, Oriented to Situation Alcohol / Substance Use: Not Applicable Psych Involvement: No (comment)  Admission diagnosis:  Acute respiratory failure with hypoxia (HCC) [J96.01] 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 Acute respiratory failure with hypoxia ([place]) 07/12/2019  . HIV (human immunodeficiency virus  infection) ([place]) 07/12/2019  . Ruptured appendicitis 01/22/2017  . Acute appendicitis    PCP:  Patient, No Pcp Per Pharmacy:   [id number] [address], [address] AT [place] [address] Phone: [phone] Fax: [phone]  [place] [address]), [place]) [place] [address] Phone: [phone] Fax: [phone]     Social Determinants of Health (SDOH) Interventions    Readmission Risk Interventions No flowsheet data found.

## 2019-07-14 NOTE — Progress Notes (Signed)
[facility]  - [place] [facility]   PATIENT NAME: Sean Ayers    MR#:  [id number]  DATE OF BIRTH:  Sep 24, 1990  SUBJECTIVE:  Pt had some respiratory distress early hours of the morning. RT came by to evaluate sats were doing good. Night nurse placed him on 15 L nasal cannula oxygen. Currently on 3 L nasal cannula oxygen.  REVIEW OF SYSTEMS:   Review of Systems  Constitutional: Negative for chills, fever and weight loss.  HENT: Negative for ear discharge, ear pain and nosebleeds.   Eyes: Negative for blurred vision, pain and discharge.  Respiratory: Negative for sputum production, shortness of breath, wheezing and stridor.   Cardiovascular: Negative for chest pain, palpitations, orthopnea and PND.  Gastrointestinal: Negative for abdominal pain, diarrhea, nausea and vomiting.  Genitourinary: Negative for frequency and urgency.  Musculoskeletal: Negative for back pain and joint pain.  Neurological: Negative for sensory change, speech change, focal weakness and weakness.  Psychiatric/Behavioral: Negative for depression and hallucinations. The patient is not nervous/anxious.    Tolerating Diet:yes Tolerating PT: pending  DRUG ALLERGIES:  No Known Allergies  VITALS:  Blood pressure (!) 105/58, pulse 83, temperature 97.7 F (36.5 C), resp. rate 18, height 5\' 11"  (1.803 m), weight 58.8 kg, SpO2 94 %.  PHYSICAL EXAMINATION:   Physical Exam  GENERAL:  29 y.o.-year-old patient lying in the bed with mild to modrate acute distress.  Patient is thin, cachectic, anxious EYES: Pupils equal, round, reactive to light and accommodation. No scleral icterus.   HEENT: Head atraumatic, normocephalic. Oropharynx and nasopharynx clear. No oral thrush noted NECK:  Supple, no jugular venous distention. No thyroid enlargement, no tenderness.  LUNGS: coarse breath sounds bilaterally, no wheezing, rales, rhonchi. No use of accessory muscles of respiration.  CARDIOVASCULAR: S1, S2  normal. No murmurs, rubs, or gallops. tachycardia ABDOMEN: Soft, nontender, nondistended. Bowel sounds present. No organomegaly or mass.  EXTREMITIES: No cyanosis, clubbing or edema b/l.    NEUROLOGIC: Cranial nerves II through XII are intact. No focal Motor or sensory deficits b/l.   PSYCHIATRIC:  patient is alert and oriented x 3.  SKIN: No obvious rash, lesion, or ulcer. Tattoo+  LABORATORY PANEL:  CBC Recent Labs  Lab 07/13/19 0524  WBC 3.7*  HGB 9.7*  HCT 29.9*  PLT 220    Chemistries  Recent Labs  Lab 07/12/19 2047 07/12/19 2047 07/13/19 0524  NA 135   < > 135  K 3.6   < > 3.5  CL 102   < > 104  CO2 24   < > 25  GLUCOSE 126*   < > 104*  BUN 12   < > 10  CREATININE 0.76   < > 0.77  CALCIUM 8.4*   < > 7.7*  MG  --   --  2.0  AST 40  --   --   ALT 20  --   --   ALKPHOS 101  --   --   BILITOT 0.7  --   --    < > = values in this interval not displayed.   Cardiac Enzymes No results for input(s): TROPONINI in the last 168 hours. RADIOLOGY:  CT Angio Chest PE W and/or Wo Contrast  Result Date: 07/12/2019 CLINICAL DATA:  Worsening shortness of breath, tachycardia, tachypnea EXAM: CT ANGIOGRAPHY CHEST WITH CONTRAST TECHNIQUE: Multidetector CT imaging of the chest was performed using the standard protocol during bolus administration of intravenous contrast. Multiplanar CT image reconstructions and MIPs were  obtained to evaluate the vascular anatomy. CONTRAST:  91mL OMNIPAQUE IOHEXOL 350 MG/ML SOLN COMPARISON:  07/12/2019 FINDINGS: Cardiovascular: This is a technically adequate evaluation of the pulmonary vasculature. No filling defects or pulmonary emboli. Heart is unremarkable without pericardial effusion. Thoracic aorta is unremarkable without aneurysm or dissection. Mediastinum/Nodes: No enlarged mediastinal, hilar, or axillary lymph nodes. Thyroid gland, trachea, and esophagus demonstrate no significant findings. Lungs/Pleura: Diffuse ground-glass airspace disease is seen  throughout the lungs, without lobar predilection. Denser consolidation at the left lung base may reflect superimposed atelectasis or dense airspace disease. Cystic changes are seen within the upper lung zones. No effusion or pneumothorax. The central airways are patent. Upper Abdomen: Limited imaging through the upper abdomen is unremarkable. Musculoskeletal: There are no acute or destructive bony lesions. Reconstructed images demonstrate no additional findings. Review of the MIP images confirms the above findings. IMPRESSION: 1. No evidence of pulmonary embolus. 2. Diffuse bilateral ground-glass airspace disease, with upper lobe predominant cystic changes as above. If the patient is immunocompromised, atypical infections such as pneumocystis could give this pattern. Hypersensitivity pneumonitis, drug toxicity, or nonspecific interstitial pneumonitis could give a similar pattern. 3. Dense left lower lobe consolidation which could reflect atelectasis or superimposed bronchopneumonia. Electronically Signed   By: Randa Ngo M.D.   On: 07/12/2019 22:32   DG Chest Port 1 View  Result Date: 07/12/2019 CLINICAL DATA:  Shortness of breath EXAM: PORTABLE CHEST 1 VIEW COMPARISON:  06/27/2019 FINDINGS: Worsened bilateral hazy and ground-glass opacities left greater than right. Normal heart size. No pleural effusion or pneumothorax. IMPRESSION: Interval worsening of left greater than right hazy and ground-glass opacities, possibly due to pneumonia, to include atypical etiologies. Electronically Signed   By: Donavan Foil M.D.   On: 07/12/2019 20:54   ASSESSMENT AND PLAN:  Sean Ayers is a 29 y.o. male with medical history significant for appendicitis s/p appendectomy 01/22/2017 and recently diagnosed HIV who is admitted with acute respiratory failure with hypoxia due to suspected pneumocystis pneumonia.  Acute respiratory failure with hypoxia due to suspected due to opportunitistic infection with  pneumocystis  pneumonia: -Patient with significant hypoxia on arrival initially requiring nonrebreather at 15 L now weaned down to 3 L  O2 . - CTA chest shows diffuse bilateral groundglass airspace disease with upper lobe predominant cystic changes. -ID and Pulmonary consulted--appreciate input -Continue IV Bactrim 15 mg/kg/day -on  prednisone 40 mg twice daily  -Continue supplemental oxygen and wean down as able -varicella zoster PCR, CMV DNA, serum fungitell, KOH Prep, pneumocystis PCR -PRN breathing treatments  Newly diagnosed HIV: HIV antibody positive 06/27/2019 -differentiation showed HIV 1 antibody positive, HIV 2 antibody negative.  Patient found out about positive test day before admission. -CD4 17 - HIV RNA pending -Hep B and C nonreactive -ID recommends HAART in 5-7 days  Esophageal candidiasis-- recently diagnosed -patient was given prescription for Diflucan -currently I do not see any evidence of oral thrush.  DVT prophylaxis: Lovenox Code Status: Full code Family Communication: discussed with his significant other Joshua Disposition Plan: From home, disposition pending further evaluation and management of acute respiratory failure with hypoxia requiring high Fio2 and infectious disease consultation for likely initiation of antiretroviral therapy. Consults called: Infectious disease, Pulmonary consult   TOTAL TIME TAKING CARE OF THIS PATIENT: *25* minutes.  >50% time spent on counselling and coordination of care  Note: This dictation was prepared with Dragon dictation along with smaller phrase technology. Any transcriptional errors that result from this process are unintentional.  Fritzi Mandes  M.D    [facility]   CC: Primary care physician; Patient, No Pcp PerPatient ID: Hewitt Shorts, male   DOB: 02-18-1991, 29 y.o.   MRN: [id number]

## 2019-07-15 DIAGNOSIS — B59 Pneumocystosis: Secondary | ICD-10-CM

## 2019-07-15 DIAGNOSIS — Z21 Asymptomatic human immunodeficiency virus [HIV] infection status: Secondary | ICD-10-CM

## 2019-07-15 DIAGNOSIS — J9601 Acute respiratory failure with hypoxia: Secondary | ICD-10-CM

## 2019-07-15 LAB — CRYPTOCOCCUS ANTIGEN, SERUM: Cryptococcus Antigen, Serum: NEGATIVE

## 2019-07-15 LAB — FUNGITELL, SERUM
Fungitell Result: 346 pg/mL — ABNORMAL HIGH (ref <80)
Fungitell Result: 304 pg/mL — ABNORMAL HIGH (ref <80)

## 2019-07-15 LAB — TORCH-IGM(TOXO/ RUB/ CMV/ HSV) W TITER
CMV IgM: <30 AU/mL (ref 0.0–29.9)
HSVI/II Comb IgM: <0.91 Ratio (ref 0.00–0.90)
Rubella IgM: <20 AU/mL (ref 0.0–19.9)
Toxoplasma Antibody- IgM: <3 AU/mL (ref 0.0–7.9)

## 2019-07-15 LAB — INFECT DISEASE AB IGM REFLEX 1

## 2019-07-15 MED ORDER — SULFAMETHOXAZOLE-TRIMETHOPRIM 800-160 MG PO TABS
2.0000 | ORAL_TABLET | Freq: Three times a day (TID) | ORAL | Status: DC
  Administered 2019-07-15 – 2019-07-16 (×5): 2 via ORAL
  Filled 2019-07-15 (×7): qty 2

## 2019-07-15 MED ORDER — FLUCONAZOLE 100 MG PO TABS: 100.0000 mg | ORAL_TABLET | Freq: Every day | ORAL | 0 refills | Status: AC

## 2019-07-15 MED ORDER — FLUCONAZOLE 100 MG PO TABS
100.0000 mg | ORAL_TABLET | Freq: Every day | ORAL | Status: DC
  Administered 2019-07-15 – 2019-07-16 (×2): 100 mg via ORAL
  Filled 2019-07-15 (×2): qty 1

## 2019-07-15 NOTE — Progress Notes (Signed)
Pulmonary Medicine          Date: 07/15/2019,   MRN# [id number] Sean Ayers 1991-02-15     AdmissionWeight: 40.8 kg                 CurrentWeight: 57.4 kg      CHIEF COMPLAINT:   Bilateral opportunistic atypical pneumonia   SUBJECTIVE   Patient is sitting up in bed and smiling. He is able to speak in full sentences without gasping for air which is an improvement from previous.  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 Avon Gully.  I had a long discussion with patient regarding compliance with antiretroviral therapy and abstinence from illicit drugs.  He may need bronchoalveolar lavage at some point if we are unable to elucidate etiology accurately.  PAST MEDICAL HISTORY   Past Medical History:  Diagnosis Date  . Acute appendicitis   . Closed fracture of medial malleolus of right ankle 04/22/2018     SURGICAL HISTORY   Past Surgical History:  Procedure Laterality Date  . LAPAROSCOPIC APPENDECTOMY N/A 01/22/2017   Procedure: APPENDECTOMY LAPAROSCOPIC;  Surgeon: Ricarda Frame, MD;  Location: [facility] ORS;  Service: General;  Laterality: N/A;     FAMILY HISTORY   Family History  Problem Relation Age of Onset  . Healthy Mother   . Healthy Father   . Brain cancer Other      SOCIAL HISTORY   Social History   Tobacco Use  . Smoking status: Current Every Day Smoker    Packs/day: 0.10    Types: Cigarettes  . Smokeless tobacco: Never Used  Substance Use Topics  . Alcohol use: No  . Drug use: No     MEDICATIONS    Home Medication:    Current Medication:  Current Facility-Administered Medications:  .  0.9 %  sodium chloride infusion, , Intravenous, PRN, Enedina Finner, MD, Stopped at 07/13/19 2136 .  acetaminophen (TYLENOL) tablet 650 mg, 650 mg, Oral, Q6H PRN **OR** acetaminophen (TYLENOL) suppository 650 mg, 650 mg, Rectal, Q6H PRN, Charlsie Quest, MD .  ALPRAZolam Prudy Feeler) tablet 0.25 mg, 0.25 mg, Oral, BID PRN, Enedina Finner, MD, 0.25 mg at 07/15/19 1245 .  enoxaparin (LOVENOX) injection 40 mg, 40 mg, Subcutaneous, Q24H, Darreld Mclean R, MD, 40 mg at 07/14/19 2307 .  feeding supplement (ENSURE ENLIVE) (ENSURE ENLIVE) liquid 237 mL, 237 mL, Oral, TID BM, Enedina Finner, MD, 237 mL at 07/15/19 1033 .  fluconazole (DIFLUCAN) tablet 100 mg, 100 mg, Oral, Daily, Enedina Finner, MD, 100 mg at 07/15/19 1454 .  guaiFENesin-dextromethorphan (ROBITUSSIN DM) 100-10 MG/5ML syrup 5 mL, 5 mL, Oral, Q4H PRN, Enedina Finner, MD, 5 mL at 07/14/19 2306 .  ipratropium-albuterol (DUONEB) 0.5-2.5 (3) MG/3ML nebulizer solution 3 mL, 3 mL, Nebulization, Q4H PRN, Enedina Finner, MD, 3 mL at 07/13/19 0743 .  multivitamin with minerals tablet 1 tablet, 1 tablet, Oral, Daily, Enedina Finner, MD, 1 tablet at 07/15/19 1031 .  ondansetron (ZOFRAN) tablet 4 mg, 4 mg, Oral, Q6H PRN **OR** ondansetron (ZOFRAN) injection 4 mg, 4 mg, Intravenous, Q6H PRN, Allena Katz, Vishal R, MD .  oxyCODONE (Oxy IR/ROXICODONE) immediate release tablet 5 mg, 5 mg, Oral, Q6H PRN, Jimmye Norman, NP, 5 mg at 07/15/19 0140 .  polyethylene glycol (MIRALAX / GLYCOLAX) packet 17 g, 17 g, Oral, Daily PRN, Enedina Finner, MD, 17 g at 07/15/19 1245 .  predniSONE (DELTASONE) tablet 40 mg, 40 mg, Oral, BID WC, Charlsie Quest, MD, 40  mg at 07/15/19 1032 .  sulfamethoxazole-trimethoprim (BACTRIM DS) 800-160 MG per tablet 2 tablet, 2 tablet, Oral, TID, Enedina Finner, MD, 2 tablet at 07/15/19 1455 .  traMADol (ULTRAM) tablet 50 mg, 50 mg, Oral, Q6H PRN, Enedina Finner, MD, 50 mg at 07/15/19 1031    ALLERGIES   Patient has no known allergies.     REVIEW OF SYSTEMS    Review of Systems:  Gen:  Denies  fever, sweats, chills weigh loss  HEENT: Denies blurred vision, double vision, ear pain, eye pain, hearing loss, nose bleeds, sore throat Cardiac:  No dizziness, chest pain or heaviness, chest tightness,edema Resp:   Denies cough or sputum porduction, shortness of breath,wheezing,  hemoptysis,  Gi: Denies swallowing difficulty, stomach pain, nausea or vomiting, diarrhea, constipation, bowel incontinence Gu:  Denies bladder incontinence, burning urine Ext:   Denies Joint pain, stiffness or swelling Skin: Denies  skin rash, easy bruising or bleeding or hives Endoc:  Denies polyuria, polydipsia , polyphagia or weight change Psych:   Denies depression, insomnia or hallucinations   Other:  All other systems negative   VS: BP 106/69 (BP Location: Right Arm)   Pulse 81   Temp (!) 97.4 F (36.3 C) (Oral)   Resp 16   Ht 5\' 11"  (1.803 m)   Wt 57.4 kg   SpO2 99%   BMI 17.66 kg/m      PHYSICAL EXAM    GENERAL:NAD, no fevers, chills, no weakness no fatigue HEAD: Normocephalic, atraumatic.  EYES: Pupils equal, round, reactive to light. Extraocular muscles intact. No scleral icterus.  MOUTH: Moist mucosal membrane. Dentition intact. No abscess noted.  EAR, NOSE, THROAT: Clear without exudates. No external lesions.  NECK: Supple. No thyromegaly. No nodules. No JVD.  PULMONARY: Decreased breath sounds bilaterally CARDIOVASCULAR: S1 and S2. Regular rate and rhythm. No murmurs, rubs, or gallops. No edema. Pedal pulses 2+ bilaterally.  GASTROINTESTINAL: Soft, nontender, nondistended. No masses. Positive bowel sounds. No hepatosplenomegaly.  MUSCULOSKELETAL: No swelling, clubbing, or edema. Range of motion full in all extremities.  NEUROLOGIC: Cranial nerves II through XII are intact. No gross focal neurological deficits. Sensation intact. Reflexes intact.  SKIN: No ulceration, lesions, rashes, or cyanosis. Skin warm and dry. Turgor intact.  PSYCHIATRIC: Mood, affect within normal limits. The patient is awake, alert and oriented x 3. Insight, judgment intact.       IMAGING    DG Chest 2 View  Result Date: 06/27/2019 CLINICAL DATA:  Chest pain. Nausea and vomiting. Shortness of breath. EXAM: CHEST - 2 VIEW COMPARISON:  03/18/2018 FINDINGS: There is new diffuse  haziness in both lungs primary in the mid zones but also in the left lung base adjacent to the left heart border on the PA view. Heart size and vascularity are normal. No effusions. No bone abnormality. IMPRESSION: New faint bilateral pulmonary infiltrates. Electronically Signed   By: 03/20/2018 M.D.   On: 06/27/2019 13:12   CT Angio Chest PE W and/or Wo Contrast  Result Date: 07/12/2019 CLINICAL DATA:  Worsening shortness of breath, tachycardia, tachypnea EXAM: CT ANGIOGRAPHY CHEST WITH CONTRAST TECHNIQUE: Multidetector CT imaging of the chest was performed using the standard protocol during bolus administration of intravenous contrast. Multiplanar CT image reconstructions and MIPs were obtained to evaluate the vascular anatomy. CONTRAST:  35mL OMNIPAQUE IOHEXOL 350 MG/ML SOLN COMPARISON:  07/12/2019 FINDINGS: Cardiovascular: This is a technically adequate evaluation of the pulmonary vasculature. No filling defects or pulmonary emboli. Heart is unremarkable without pericardial effusion. Thoracic aorta is  unremarkable without aneurysm or dissection. Mediastinum/Nodes: No enlarged mediastinal, hilar, or axillary lymph nodes. Thyroid gland, trachea, and esophagus demonstrate no significant findings. Lungs/Pleura: Diffuse ground-glass airspace disease is seen throughout the lungs, without lobar predilection. Denser consolidation at the left lung base may reflect superimposed atelectasis or dense airspace disease. Cystic changes are seen within the upper lung zones. No effusion or pneumothorax. The central airways are patent. Upper Abdomen: Limited imaging through the upper abdomen is unremarkable. Musculoskeletal: There are no acute or destructive bony lesions. Reconstructed images demonstrate no additional findings. Review of the MIP images confirms the above findings. IMPRESSION: 1. No evidence of pulmonary embolus. 2. Diffuse bilateral ground-glass airspace disease, with upper lobe predominant cystic changes  as above. If the patient is immunocompromised, atypical infections such as pneumocystis could give this pattern. Hypersensitivity pneumonitis, drug toxicity, or nonspecific interstitial pneumonitis could give a similar pattern. 3. Dense left lower lobe consolidation which could reflect atelectasis or superimposed bronchopneumonia. Electronically Signed   By: Randa Ngo M.D.   On: 07/12/2019 22:32   DG Chest Port 1 View  Result Date: 07/12/2019 CLINICAL DATA:  Shortness of breath EXAM: PORTABLE CHEST 1 VIEW COMPARISON:  06/27/2019 FINDINGS: Worsened bilateral hazy and ground-glass opacities left greater than right. Normal heart size. No pleural effusion or pneumothorax. IMPRESSION: Interval worsening of left greater than right hazy and ground-glass opacities, possibly due to pneumonia, to include atypical etiologies. Electronically Signed   By: Donavan Foil M.D.   On: 07/12/2019 20:54      ASSESSMENT/PLAN   Acute hypoxemic respiratory failure  - due to bilateral most likely opportunistic consolidated atypical pneumonia  - patient with cachexia likely due to HIV/AIDS - CD4 counts pending   - suspect PJP pneumonia - will obtain ABG -patient already on steroids  - pneumocystis PRC via sputum may need BAL - patient currently on bactrim IV per ID  -CMV PCR -torch IgM -KOH prep for sputum -serum fungitell -sputum bacterial cultures x 3 -VZV pcr -RVP without droplet precautions -blood cultures in process -Incentive spirometry -albuterol MDI -ID on case appreciate input     Thank you for allowing me to participate in the care of this patient.   Patient/Family are satisfied with care plan and all questions have been answered.  This document was prepared using Dragon voice recognition software and may include unintentional dictation errors.     Ottie Glazier, M.D.  Division of [place]

## 2019-07-15 NOTE — Progress Notes (Signed)
   07/15/19 1010  Clinical Encounter Type  Visited With Patient  Visit Type Follow-up;Spiritual support  Referral From Chaplain  Consult/Referral To Chaplain  While doing rounds Chaplain briefly visited with patient. Patient explained that he slept for 5 hrs, the first time he has slept this long in months. Although he slept well, he was currently in pain. Chaplain told nurse that patient was in pain. While waiting for nurse Chaplain offered pastoral presence, empathy, and prayer.

## 2019-07-15 NOTE — Progress Notes (Signed)
O2 sat 83% on RA at rest. Placed back on O2 3L/[place] and sats 94%.

## 2019-07-15 NOTE — Progress Notes (Signed)
Date of Admission:  07/12/2019   T   ID: Sean Ayers is a 29 y.o. male  Principal Problem:   Acute respiratory failure with hypoxia (HCC) Active Problems:   HIV (human immunodeficiency virus infection) ([place])    Subjective: Pt feeling better SOB better Appetite better  Medications:  . enoxaparin (LOVENOX) injection  40 mg Subcutaneous Q24H  . feeding supplement (ENSURE ENLIVE)  237 mL Oral TID BM  . fluconazole  100 mg Oral Daily  . multivitamin with minerals  1 tablet Oral Daily  . predniSONE  40 mg Oral BID WC  . sulfamethoxazole-trimethoprim  2 tablet Oral TID    Objective: Vital signs in last 24 hours: Temp:  [97.3 F (36.3 C)-98.1 F (36.7 C)] 97.3 F (36.3 C) (02/26 1237) Pulse Rate:  [74-85] 75 (02/26 1237) Resp:  [16-18] 16 (02/26 0509) BP: (100-109)/(61-72) 109/72 (02/26 1237) SpO2:  [94 %-97 %] 94 % (02/26 1237) Weight:  [57.4 kg] 57.4 kg (02/26 0509)  PHYSICAL EXAM:  General: Alert, cooperative, no distress Oxygen 83% on RA and 95% on 3 L at rest Head: Normocephalic, without obvious abnormality, atraumatic. Eyes: Conjunctivae clear, anicteric sclerae. Pupils are equal ENT Nares normal. No drainage or sinus tenderness. Lips, mucosa, and tongue normal. No Thrush Neck: Supple, symmetrical, no adenopathy, thyroid: non tender no carotid bruit and no JVD. Back: No CVA tenderness. Lungs: Clear to auscultation bilaterally. No Wheezing or Rhonchi. No rales. Heart: Regular rate and rhythm, no murmur, rub or gallop. Abdomen: Soft, non-tender,not distended. Bowel sounds normal. No masses Extremities: atraumatic, no cyanosis. No edema. No clubbing Skin: No rashes or lesions. Or bruising Lymph: Cervical, supraclavicular normal. Neurologic: Grossly non-focal  Lab Results Recent Labs    07/12/19 2047 07/12/19 2047 07/13/19 0524  WBC 12.4*   < > 3.6  3.7*  HGB 11.7*  --  9.7*  9.8*  HCT 34.9*  --  29.9*  29.5*  NA 135  --  135  K 3.6  --  3.5  CL  102  --  104  CO2 24  --  25  BUN 12  --  10  CREATININE 0.76  --  0.77   < > = values in this interval not displayed.   Liver Panel Recent Labs    07/12/19 2047  PROT 7.5  ALBUMIN 2.7*  AST 40  ALT 20  ALKPHOS 101  BILITOT 0.7    Infectious Disease work up [place] is 17 VL is 160,000 Toxoplasma is < 3 Cryptococcus antigen neg CMV DNA pending Beta D glucan is 346 RPR -NR  Studies/Results: CT scan chest- GGO  Assessment/Plan:  Newly diagnosed AIDS- not ngaged in care yet  Acute hypoxic resp failure- due to GGO lungs PJP/PCP is the # 1 in D.D He does not have bronch and bal to confirm it Beta D glucan is elevated which goes in favor of PCP as other fungal infections are less likely to present as Ground glass diffuse opacity  Kaposi sarcoma can present like this but les slikely in his case He will have to be treated with bactrim DS 2 TID for 21 days along with steroids . Fluconazole for oropharyngeal candidiasis   Discussed with Dr.Aleskerov . HE will follow him as OP to make sure that he is getting better.  HE has an appt at [facility] on 3/11. They have applied for Ryan white coverage for him- Spoke to [name] case Freight forwarder at [facility].  The clinician at [facility]  is NP Eula Flax  HE will start HAART once RW approved    Discussed the management with patient in detail. Also discussed with Dr.PAtel and Dr.Aleskerov. If ID follow up is needed please contact Dr.fitzgerald

## 2019-07-15 NOTE — TOC Progression Note (Signed)
Transition of Care [facility]) - Progression Note    Patient Details  Name: Sean Ayers MRN: [id number] Date of Birth: 03-Jun-1990  Transition of Care [facility]) CM/SW Contact  Shawn Route, RN Phone Number: 07/15/2019, 11:40 AM  Clinical Narrative:    Patient medications have been sent to Medication Management.  These will be picked up and delivered to patients room.  [facility] to provide Oxygen and supplies to patient.  Patient followup appt with [facility] placed on AVS.  No further TOC needs at this time, please re-consult for new needs.    Expected Discharge Plan: Home/Self Care Barriers to Discharge: Continued Medical Work up  Expected Discharge Plan and Services Expected Discharge Plan: Home/Self Care   Discharge Planning Services: CM Consult, Other - See comment([name], [facility], Medication Management.)   Living arrangements for the past 2 months: Single Family Home                 DME Arranged: Oxygen DME Agency: [facility] Date DME Agency Contacted: 07/15/19 Time DME Agency Contacted: 1140 Representative spoke with at DME Agency: Nida Boatman             Social Determinants of Health (SDOH) Interventions    Readmission Risk Interventions No flowsheet data found.

## 2019-07-15 NOTE — Progress Notes (Signed)
SATURATION QUALIFICATIONS: (This note is used to comply with regulatory documentation for home oxygen)  Patient Saturations on Room Air at Rest = 83%  Patient Saturations on Room Air while Ambulating =%  Patient Saturations on  Liters of oxygen while Ambulating =%  Please briefly explain why patient needs home oxygen: RA Sats 83%.  O2 Sats on 3L/[place] is 94%.Marland Kitchen at rest.

## 2019-07-15 NOTE — Progress Notes (Signed)
[place] [place] NAME: Sean Ayers    MR#:  [id number]  DATE OF BIRTH:  1991-04-26  SUBJECTIVE:  Currently on 3 L nasal cannula oxygen. Patient smiling and in better spirits today. Partner Vonna Kotyk in the room. Had a good conversation with patient and partner. Overall appetite improving. Breathing somewhat stable.  REVIEW OF SYSTEMS:   Review of Systems  Constitutional: Negative for chills, fever and weight loss.  HENT: Negative for ear discharge, ear pain and nosebleeds.   Eyes: Negative for blurred vision, pain and discharge.  Respiratory: Negative for sputum production, shortness of breath, wheezing and stridor.   Cardiovascular: Negative for chest pain, palpitations, orthopnea and PND.  Gastrointestinal: Negative for abdominal pain, diarrhea, nausea and vomiting.  Genitourinary: Negative for frequency and urgency.  Musculoskeletal: Negative for back pain and joint pain.  Neurological: Negative for sensory change, speech change, focal weakness and weakness.  Psychiatric/Behavioral: Negative for depression and hallucinations. The patient is not nervous/anxious.    Tolerating Diet:yes Tolerating PT: ambulatory  DRUG ALLERGIES:  No Known Allergies  VITALS:  Blood pressure 109/72, pulse 75, temperature (!) 97.3 F (36.3 C), temperature source Oral, resp. rate 16, height 5\' 11"  (1.803 m), weight 57.4 kg, SpO2 94 %.  PHYSICAL EXAMINATION:   Physical Exam  GENERAL:  29 y.o.-year-old patient lying in the bed with no  acute distress.  Patient is thin, cachectic EYES: Pupils equal, round, reactive to light and accommodation. No scleral icterus.   HEENT: Head atraumatic, normocephalic. Oropharynx and nasopharynx clear. No oral thrush noted NECK:  Supple, no jugular venous distention. No thyroid enlargement, no tenderness.  LUNGS:  Decreased breath sounds bilaterally, no wheezing, rales, rhonchi. No use of accessory muscles of  respiration.  CARDIOVASCULAR: S1, S2 normal. No murmurs, rubs, or gallops. tachycardia ABDOMEN: Soft, nontender, nondistended. Bowel sounds present. No organomegaly or mass.  EXTREMITIES: No cyanosis, clubbing or edema b/l.    NEUROLOGIC: Cranial nerves II through XII are intact. No focal Motor or sensory deficits b/l.   PSYCHIATRIC:  patient is alert and oriented x 3.  SKIN: No obvious rash, lesion, or ulcer. Tattoo+  LABORATORY PANEL:  CBC Recent Labs  Lab 07/13/19 0524  WBC 3.6  3.7*  HGB 9.7*  9.8*  HCT 29.9*  29.5*  PLT 220  216    Chemistries  Recent Labs  Lab 07/12/19 2047 07/12/19 2047 07/13/19 0524  NA 135   < > 135  K 3.6   < > 3.5  CL 102   < > 104  CO2 24   < > 25  GLUCOSE 126*   < > 104*  BUN 12   < > 10  CREATININE 0.76   < > 0.77  CALCIUM 8.4*   < > 7.7*  MG  --   --  2.0  AST 40  --   --   ALT 20  --   --   ALKPHOS 101  --   --   BILITOT 0.7  --   --    < > = values in this interval not displayed.   Cardiac Enzymes No results for input(s): TROPONINI in the last 168 hours. RADIOLOGY:  No results found. ASSESSMENT AND PLAN:  [name] is a 29 y.o. male with medical history significant for appendicitis s/p appendectomy 01/22/2017 and recently diagnosed HIV who is admitted with acute respiratory failure with hypoxia due to suspected pneumocystis pneumonia.  Acute respiratory failure with  hypoxia due to suspected due to opportunitistic infection with  pneumocystis pneumonia: -Patient with significant hypoxia on arrival initially requiring nonrebreather at 15 L now weaned down to 3 L [place] O2 .--pt doees qualify for home oxygen - CTA chest shows diffuse bilateral groundglass airspace disease with upper lobe predominant cystic changes. -ID and Pulmonary consulted--appreciate input -Continue IV Bactrim 15 mg/kg/day--change to po Bactrim DS 2 tabs tid x21days per ID -on  prednisone 40 mg twice daily --with taper dose -varicella zoster PCR, CMV DNA,  serum fungitell, KOH Prep, pneumocystis PCR -PRN breathing treatments  Newly diagnosed HIV: HIV antibody positive 06/27/2019 -differentiation showed HIV 1 antibody positive, HIV 2 antibody negative.  Patient found out about positive test day before admission. -CD4 17 - HIV RNA pending -Hep B and C nonreactive -ID recommends HAART in 5-7 days--pt has been set up at [facility] f/u  Esophageal candidiasis-- recently diagnosed -empiric diflucan 100 milligrams daily for 14 days -currently I do not see any evidence of oral thrush.  DVT prophylaxis: Lovenox Code Status: Full code Family Communication: discussed with his significant other Joshua Disposition Plan: From home tomorrow if remains stable Consults called: Infectious disease, Pulmonary consult   TOTAL TIME TAKING CARE OF THIS PATIENT: *25* minutes.  >50% time spent on counselling and coordination of care  Note: This dictation was prepared with Dragon dictation along with smaller phrase technology. Any transcriptional errors that result from this process are unintentional.  Enedina Finner M.D    [facility]   CC: Primary care physician; Patient, No Pcp PerPatient ID: Hewitt Shorts, male   DOB: 01/30/1991, 30 y.o.   MRN: [id number]

## 2019-07-16 DIAGNOSIS — B59 Pneumocystosis: Secondary | ICD-10-CM

## 2019-07-16 LAB — CULTURE, RESPIRATORY W GRAM STAIN
Culture: NORMAL
Gram Stain: NONE SEEN

## 2019-07-16 LAB — CMV DNA, QUANTITATIVE, PCR
CMV DNA Quant: POSITIVE IU/mL
Log10 CMV Qn DNA Pl: UNDETERMINED log10 IU/mL

## 2019-07-16 LAB — VARICELLA-ZOSTER BY PCR: Varicella-Zoster, PCR: NEGATIVE

## 2019-07-16 MED ORDER — ENSURE ENLIVE PO LIQD: 237.0000 mL | Freq: Three times a day (TID) | ORAL | 12 refills | Status: AC

## 2019-07-16 MED ORDER — TRAMADOL HCL 50 MG PO TABS: 50.0000 mg | ORAL_TABLET | Freq: Four times a day (QID) | ORAL | 0 refills | Status: AC | PRN

## 2019-07-16 MED ORDER — SULFAMETHOXAZOLE-TRIMETHOPRIM 800-160 MG PO TABS: 2.0000 | ORAL_TABLET | Freq: Three times a day (TID) | ORAL | 0 refills | Status: AC

## 2019-07-16 MED ORDER — ALBUTEROL SULFATE HFA 108 (90 BASE) MCG/ACT IN AERS: 2.0000 | INHALATION_SPRAY | Freq: Four times a day (QID) | RESPIRATORY_TRACT | 0 refills | Status: AC | PRN

## 2019-07-16 MED ORDER — PREDNISONE 20 MG PO TABS: 40.0000 mg | ORAL_TABLET | Freq: Two times a day (BID) | ORAL | 0 refills | Status: AC

## 2019-07-16 MED ORDER — OXYCODONE HCL 5 MG PO TABS
5.0000 mg | ORAL_TABLET | Freq: Once | ORAL | Status: AC
  Administered 2019-07-16: 16:00:00 5 mg via ORAL
  Filled 2019-07-16: qty 1

## 2019-07-16 MED ORDER — GUAIFENESIN-DM 100-10 MG/5ML PO SYRP: 5.0000 mL | ORAL_SOLUTION | ORAL | 0 refills | Status: AC | PRN

## 2019-07-16 NOTE — Discharge Summary (Addendum)
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 Acute appendicitis   . Closed fracture of medial malleolus of right ankle 04/22/2018    HOSPITAL COURSE:  Sean Ayers a 28 y.o.malewith medical history significant forappendicitis s/p appendectomy 01/22/2017 and recently diagnosed HIV whois admitted with acute respiratory failure with hypoxia due to suspected pneumocystis pneumonia.  Acute respiratory failure with hypoxia due to suspected due to opportunitistic infection with  pneumocystis pneumonia: -Patient with significant hypoxia on arrival initially requiring nonrebreather at 15 L now weaned down to 3 L [place] O2 .--pt does qualify for home oxygen -CTA chestshows diffuse bilateral groundglass airspace disease with upper lobe predominant cystic changes. -ID and Pulmonary consulted--appreciate input -Continue IV Bactrim 15 mg/kg/day--change to po Bactrim DS 2 tabs tid x21days per ID -on  prednisone 40 mg twice daily --with taper dose -diflucan to prevent thrush -patient will follow-up with Dr. Karna Christmas as outpt  Newly diagnosed HIV: HIV antibody positive 06/27/2019-differentiation showed HIV 1 antibody positive,  HIV 2 antibody negative. Patient found out about positive test day before admission. -CD4 17 -Hep B and C nonreactive -ID recommends HAART in 5-7 days--pt has been set up at [facility] for f/u march 11th  Suspected COPD with h/o smoking since age 27 years 1 ppd -pt reports he has quit as of now -pt will be following up with Dr A --pulmonary as out pt   Esophageal candidiasis-- recently diagnosed -empiric diflucan 100 milligrams daily for 14 days -currently I do not see any evidence of oral thrush.  DVT prophylaxis:Lovenox Code Status:Full code Family Communication:discussed with his significant other Ivin Booty Disposition Plan:From home today Consults called:Infectious disease, Pulmonaryconsult   CONSULTS OBTAINED:  Treatment Team:  Lynn Ito, MD Vida Rigger, MD  DRUG ALLERGIES:  No Known Allergies  DISCHARGE MEDICATIONS:   Allergies as of 07/16/2019   No Known Allergies     Medication List    TAKE these medications   albuterol 108 (90 Base) MCG/ACT inhaler Commonly known as: VENTOLIN HFA Inhale 2 puffs into the lungs every 6 (six) hours as needed for wheezing or shortness of breath.   diphenhydramine-acetaminophen 25-500 MG Tabs tablet Commonly known as: TYLENOL PM Take 1 tablet by mouth at bedtime as needed.   feeding supplement (ENSURE ENLIVE) Liqd Take 237 mLs by mouth 3 (three) times daily between meals.   fluconazole 100 MG tablet Commonly known as: DIFLUCAN Take 1 tablet (100 mg total) by mouth daily. What changed: how much to take   guaiFENesin-dextromethorphan 100-10 MG/5ML syrup Commonly known as: ROBITUSSIN DM Take 5 mLs by mouth every 4 (four) hours as needed for cough.   MULTIVITAMIN GUMMIES MENS PO Take 1 tablet by mouth daily.   predniSONE 20 MG tablet Commonly known as: DELTASONE Take 2 tablets (40 mg  total) by mouth 2 (two) times daily with a meal. Take as directed   sulfamethoxazole-trimethoprim  800-160 MG tablet Commonly known as: BACTRIM DS Take 2 tablets by mouth 3 (three) times daily for 21 days.   traMADol 50 MG tablet Commonly known as: ULTRAM Take 1 tablet (50 mg total) by mouth every 6 (six) hours as needed for moderate pain.            Durable Medical Equipment  (From admission, onward)         Start     Ordered   07/15/19 1227  For home use only DME oxygen  Once    Question Answer Comment  Length of Need 6 Months   Mode or (Route) Nasal cannula   Liters per Minute 2   Frequency Continuous (stationary and portable oxygen unit needed)   Oxygen conserving device Yes   Oxygen delivery system Gas      07/15/19 1226          If you experience worsening of your admission symptoms, develop shortness of breath, life threatening emergency, suicidal or homicidal thoughts you must seek medical attention immediately by calling 911 or calling your MD immediately  if symptoms less severe.  You Must read complete instructions/literature along with all the possible adverse reactions/side effects for all the Medicines you take and that have been prescribed to you. Take any new Medicines after you have completely understood and accept all the possible adverse reactions/side effects.   Please note  You were cared for by a hospitalist during your hospital stay. If you have any questions about your discharge medications or the care you received while you were in the hospital after you are discharged, you can call the unit and asked to speak with the hospitalist on call if the hospitalist that took care of you is not available. Once you are discharged, your primary care physician will handle any further medical issues. Please note that NO REFILLS for any discharge medications will be authorized once you are discharged, as it is imperative that you return to your primary care physician (or establish a relationship with a primary care physician if you do not have one) for your  aftercare needs so that they can reassess your need for medications and monitor your lab values. Today   SUBJECTIVE   No new complaints  VITAL SIGNS:  Blood pressure (!) 114/96, pulse 89, temperature 97.8 F (36.6 C), resp. rate 16, height 5\' 11"  (1.803 m), weight 57.4 kg, SpO2 92 %.  I/O:    Intake/Output Summary (Last 24 hours) at 07/16/2019 1228 Last data filed at 07/16/2019 0526 Gross per 24 hour  Intake --  Output 600 ml  Net -600 ml    PHYSICAL EXAMINATION:  GENERAL:  29 y.o.-year-old patient lying in the bed with no acute distress.thin, cachectic  EYES: Pupils equal, round, reactive to light and accommodation. No scleral icterus.  HEENT: Head atraumatic, normocephalic. Oropharynx and nasopharynx clear.  NECK:  Supple, no jugular venous distention. No thyroid enlargement, no tenderness.  LUNGS: Normal breath sounds bilaterally, no wheezing, rales,rhonchi or crepitation. No use of accessory muscles of respiration.  CARDIOVASCULAR: S1, S2 normal. No murmurs, rubs, or gallops.  ABDOMEN: Soft, non-tender, non-distended. Bowel sounds present. No organomegaly or mass.  EXTREMITIES: No pedal edema, cyanosis, or clubbing.  NEUROLOGIC: Cranial nerves II through XII are intact. Muscle strength 5/5 in all extremities. Sensation intact. Gait not checked.  PSYCHIATRIC: The patient is alert and oriented x 3.  SKIN: No obvious rash, lesion, or ulcer.   DATA REVIEW:   CBC  Recent Labs  Lab 07/13/19 0524  WBC 3.6  3.7*  HGB 9.7*  9.8*  HCT 29.9*  29.5*  PLT 220  216    Chemistries  Recent Labs  Lab 07/12/19 2047 07/12/19 2047 07/13/19 0524  NA 135   < > 135  K 3.6   < > 3.5  CL 102   < > 104  CO2 24   < > 25  GLUCOSE 126*   < > 104*  BUN 12   < > 10  CREATININE 0.76   < > 0.77  CALCIUM 8.4*   < > 7.7*  MG  --   --  2.0  AST 40  --   --   ALT 20  --   --   ALKPHOS 101  --   --   BILITOT 0.7  --   --    < > = values in this interval not displayed.     Microbiology Results   Recent Results (from the past 240 hour(s))  Blood Culture (routine x 2)     Status: None (Preliminary result)   Collection Time: 07/12/19  8:47 PM   Specimen: BLOOD  Result Value Ref Range Status   Specimen Description BLOOD BLOOD RIGHT ARM  Final   Special Requests   Final    BOTTLES DRAWN AEROBIC AND ANAEROBIC Blood Culture results may not be optimal due to an excessive volume of blood received in culture bottles   Culture   Final    NO GROWTH 4 DAYS Performed at [facility], [address], [place], [place] [address]    Report Status PENDING  Incomplete  Blood Culture (routine x 2)     Status: None (Preliminary result)   Collection Time: 07/12/19  8:47 PM   Specimen: BLOOD  Result Value Ref Range Status   Specimen Description BLOOD LEFT ASSIST CONTROL  Final   Special Requests   Final    BOTTLES DRAWN AEROBIC AND ANAEROBIC Blood Culture adequate volume   Culture   Final    NO GROWTH 4 DAYS Performed at [facility], [address] [place], [place] [address]    Report Status PENDING  Incomplete  Respiratory Panel by RT PCR (Flu A&B, Covid) - Nasopharyngeal Swab     Status: None   Collection Time: 07/13/19 12:06 AM   Specimen: Nasopharyngeal Swab  Result Value Ref Range Status   SARS Coronavirus 2 by RT PCR NEGATIVE NEGATIVE Final    Comment: (NOTE) SARS-CoV-2 target nucleic acids are NOT DETECTED. The SARS-CoV-2 RNA is generally detectable in upper respiratoy specimens during the acute phase of infection. The lowest concentration of SARS-CoV-2 viral copies this assay can detect is 131 copies/mL. A negative result does not preclude SARS-Cov-2 infection and should not be used as the sole basis for treatment or other patient management decisions. A negative result may occur with  improper specimen collection/handling, submission of specimen other than nasopharyngeal swab, presence of viral mutation(s) within the areas  targeted by this assay, and inadequate number of viral copies (<131 copies/mL). A negative result must be combined with clinical observations, patient history, and epidemiological information. The expected result is Negative. Fact Sheet for Patients:  https://www.moore.com/ Fact Sheet for Healthcare Providers:  https://www.young.biz/ This test is not yet ap proved or cleared by the [place] FDA and  has been authorized for detection and/or diagnosis of SARS-CoV-2  by FDA under an Emergency Use Authorization (EUA). This EUA will remain  in effect (meaning this test can be used) for the duration of the COVID-19 declaration under Section 564(b)(1) of the Act, 21 U.S.C. section 360bbb-3(b)(1), unless the authorization is terminated or revoked sooner.    Influenza A by PCR NEGATIVE NEGATIVE Final   Influenza B by PCR NEGATIVE NEGATIVE Final    Comment: (NOTE) The Xpert Xpress SARS-CoV-2/FLU/RSV assay is intended as an aid in  the diagnosis of influenza from Nasopharyngeal swab specimens and  should not be used as a sole basis for treatment. Nasal washings and  aspirates are unacceptable for Xpert Xpress SARS-CoV-2/FLU/RSV  testing. Fact Sheet for Patients: PinkCheek.be Fact Sheet for Healthcare Providers: GravelBags.it This test is not yet approved or cleared by the [place] FDA and  has been authorized for detection and/or diagnosis of SARS-CoV-2 by  FDA under an Emergency Use Authorization (EUA). This EUA will remain  in effect (meaning this test can be used) for the duration of the  Covid-19 declaration under Section 564(b)(1) of the Act, 21  U.S.C. section 360bbb-3(b)(1), unless the authorization is  terminated or revoked. Performed at [facility], [address] [place], [place] [address]   Urine culture     Status: None   Collection Time: 07/13/19  2:42 AM    Specimen: In/Out Cath Urine  Result Value Ref Range Status   Specimen Description   Final    IN/OUT CATH URINE Performed at [facility], [address] [place], [place] [address]    Special Requests   Final    NONE Performed at [facility], [address], [place], [place] [address]    Culture   Final    NO GROWTH Performed at [facility], [place] [address] [place], [place] [address]    Report Status 07/14/2019 FINAL  Final  Respiratory Panel by PCR     Status: None   Collection Time: 07/13/19  5:16 PM   Specimen: Nasopharyngeal Swab; Respiratory  Result Value Ref Range Status   Adenovirus NOT DETECTED NOT DETECTED Final   Coronavirus 229E NOT DETECTED NOT DETECTED Final    Comment: (NOTE) The Coronavirus on the Respiratory Panel, DOES NOT test for the novel  Coronavirus (2019 nCoV)    Coronavirus HKU1 NOT DETECTED NOT DETECTED Final   Coronavirus NL63 NOT DETECTED NOT DETECTED Final   Coronavirus OC43 NOT DETECTED NOT DETECTED Final   Metapneumovirus NOT DETECTED NOT DETECTED Final   Rhinovirus / Enterovirus NOT DETECTED NOT DETECTED Final   Influenza A NOT DETECTED NOT DETECTED Final   Influenza B NOT DETECTED NOT DETECTED Final   Parainfluenza Virus 1 NOT DETECTED NOT DETECTED Final   Parainfluenza Virus 2 NOT DETECTED NOT DETECTED Final   Parainfluenza Virus 3 NOT DETECTED NOT DETECTED Final   Parainfluenza Virus 4 NOT DETECTED NOT DETECTED Final   Respiratory Syncytial Virus NOT DETECTED NOT DETECTED Final   Bordetella pertussis NOT DETECTED NOT DETECTED Final   Chlamydophila pneumoniae NOT DETECTED NOT DETECTED Final   Mycoplasma pneumoniae NOT DETECTED NOT DETECTED Final    Comment: Performed at [facility], [place] [address], [place], [place] [address]  Expectorated sputum assessment w rflx to resp cult     Status: None   Collection Time: 07/14/19  4:50 AM   Specimen: Expectorated Sputum  Result Value Ref Range Status   Specimen  Description EXPECTORATED SPUTUM  Final   Special Requests Immunocompromised  Final   Sputum evaluation   Final    THIS SPECIMEN IS ACCEPTABLE FOR SPUTUM CULTURE Performed at [facility], [address] [place], [place] [address]    Report Status 07/14/2019 FINAL  Final  Culture, respiratory     Status: None   Collection Time: 07/14/19  4:50 AM  Result Value Ref Range Status   Specimen Description   Final    EXPECTORATED SPUTUM Performed at [facility], [address], [place], [place] [address]    Special Requests   Final    Immunocompromised Reflexed from [id number] Performed at [facility], [address] [place], [place] [address]    Gram Stain   Final    NO WBC SEEN RARE GRAM POSITIVE COCCI IN PAIRS IN CLUSTERS    Culture   Final    Consistent with normal respiratory flora. Performed at [facility], [address], [place], [place] [address]    Report Status 07/16/2019 FINAL  Final    RADIOLOGY:  No results found.   CODE STATUS:     Code Status Orders  (From admission, onward)         Start     Ordered   07/12/19 2345  Full code  Continuous     07/12/19 2344        Code Status History    Date Active Date Inactive Code Status Order ID Comments User Context   01/23/2017 0047 01/25/2017 1318 Full Code [id number]  Ricarda Frame, MD Inpatient   Advance Care Planning Activity       TOTAL TIME TAKING CARE OF THIS PATIENT: **40* minutes.    Enedina Finner M.D  [facility]    CC: Primary care physician; Patient, No Pcp Per

## 2019-07-17 LAB — CULTURE, BLOOD (ROUTINE X 2)
Culture: NO GROWTH
Culture: NO GROWTH
Special Requests: ADEQUATE

## 2019-07-17 LAB — QUANTIFERON-TB GOLD PLUS (RQFGPL)
QuantiFERON Mitogen Value: 0.2 IU/mL
QuantiFERON Nil Value: 0.02 IU/mL
QuantiFERON TB1 Ag Value: 0.02 IU/mL
QuantiFERON TB2 Ag Value: 0.04 IU/mL

## 2019-07-17 LAB — QUANTIFERON-TB GOLD PLUS: QuantiFERON-TB Gold Plus: UNDETERMINED — AB

## 2019-07-19 LAB — MISC LABCORP TEST (SEND OUT)
LabCorp test name: 164284
Labcorp test code: 164284

## 2019-07-26 LAB — HIV GENOSURE REFLEX - HIVGTY - ELECTRONIC RECORD

## 2019-07-26 LAB — GENOSURE INTEGRASE HIV EDI: HIV Genosure Integrase PDF 2: 1

## 2019-08-03 ENCOUNTER — Encounter: Payer: Self-pay | Admitting: *Deleted

## 2019-08-03 ENCOUNTER — Ambulatory Visit: Payer: Self-pay | Admitting: Gastroenterology

## 2019-08-31 LAB — MISC LABCORP TEST (SEND OUT): Labcorp test code: 183753

## 2019-11-30 ENCOUNTER — Telehealth: Payer: Self-pay | Admitting: General Practice

## 2019-11-30 NOTE — Telephone Encounter (Signed)
Individual has been contacted 3+ times regarding ED referral. No further attempts to contact individual will be made. 

## 2020-02-06 IMAGING — CR DG CHEST 2V
1 series · 2 of 2 positions shown · non-contrast
Comparison: None.

CLINICAL DATA: Fall with right rib pain

EXAM:
CHEST - 2 VIEW

[Series 1: dg chest 2 view · 0.14mm/px · 2 of 2 slices shown]
[im 1/2]
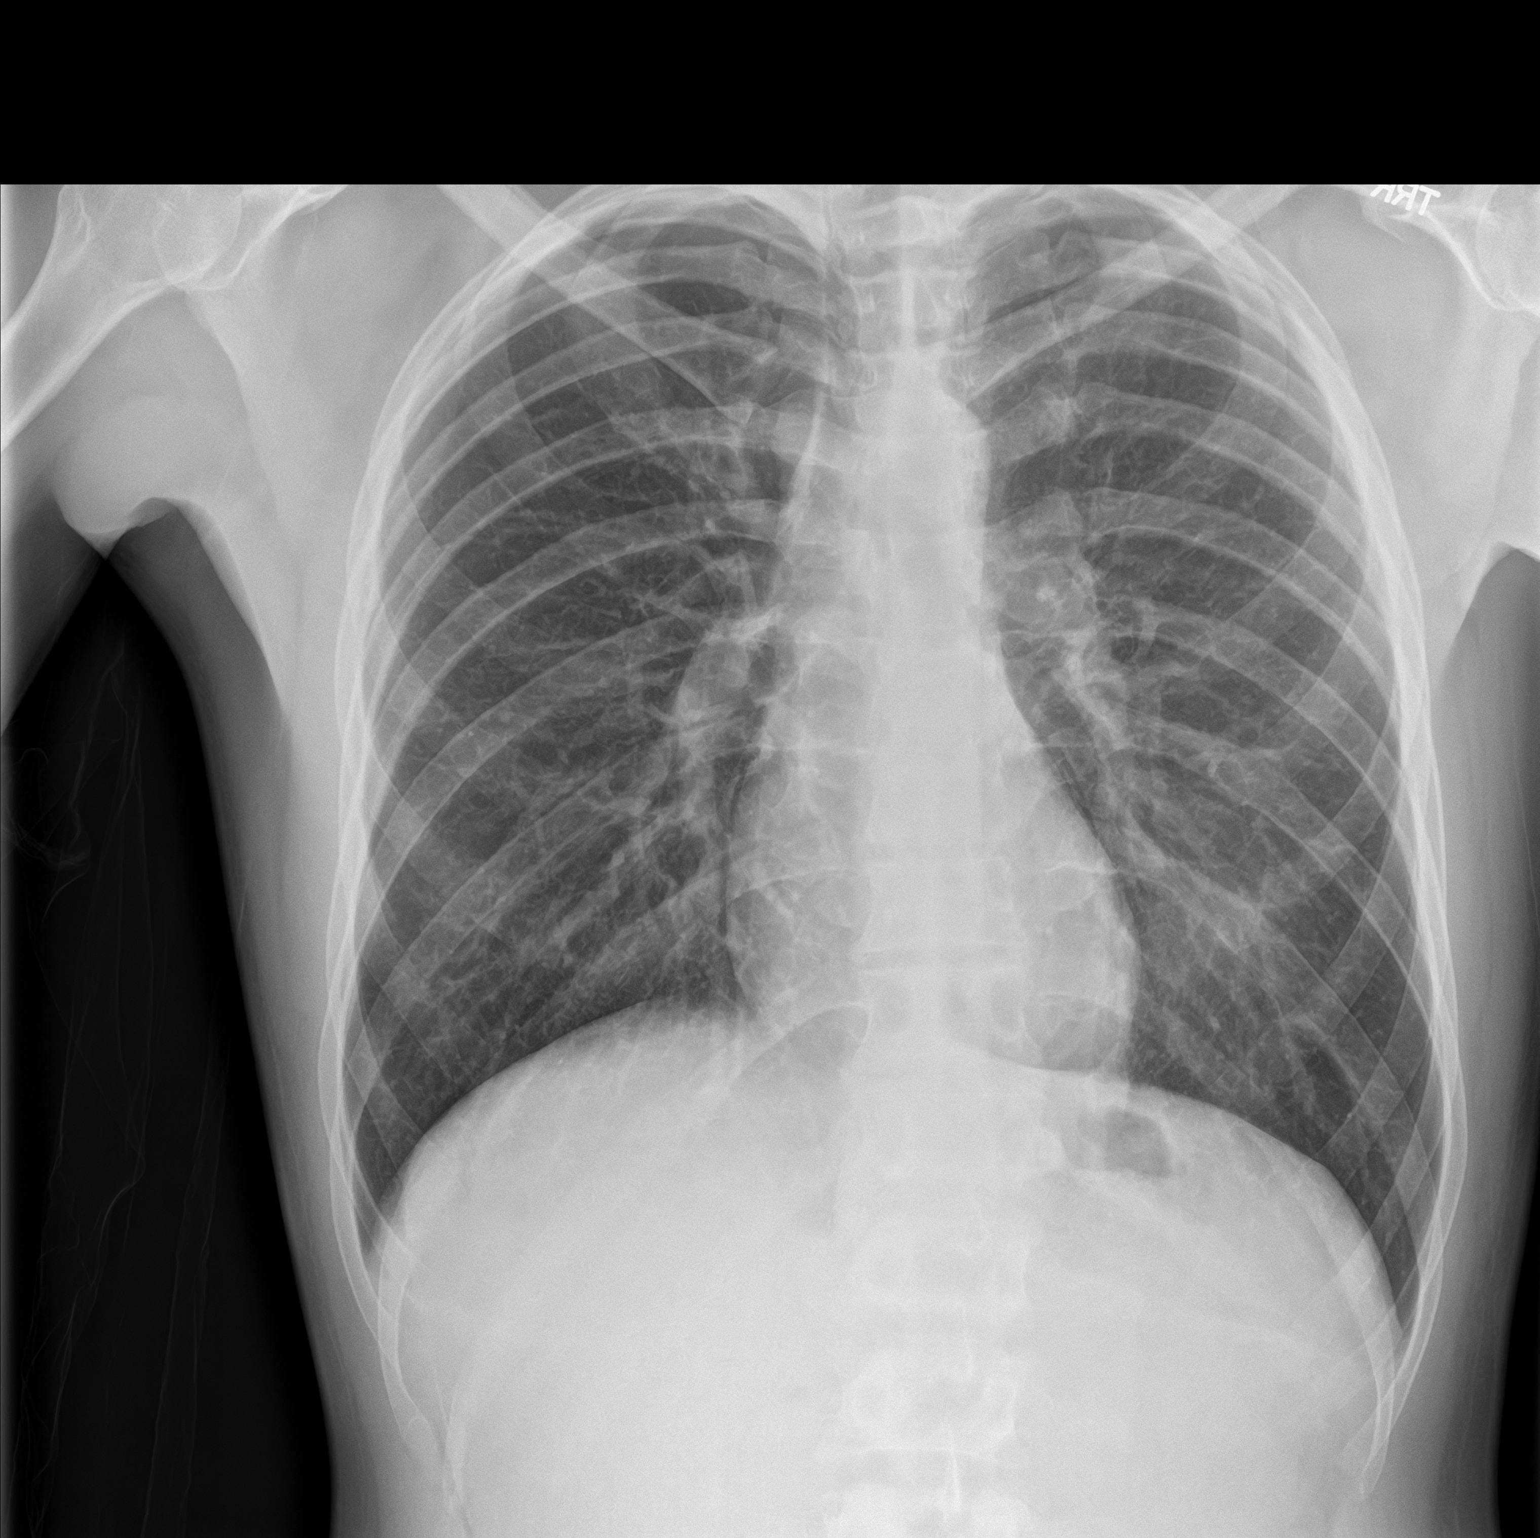
[im 2/2]
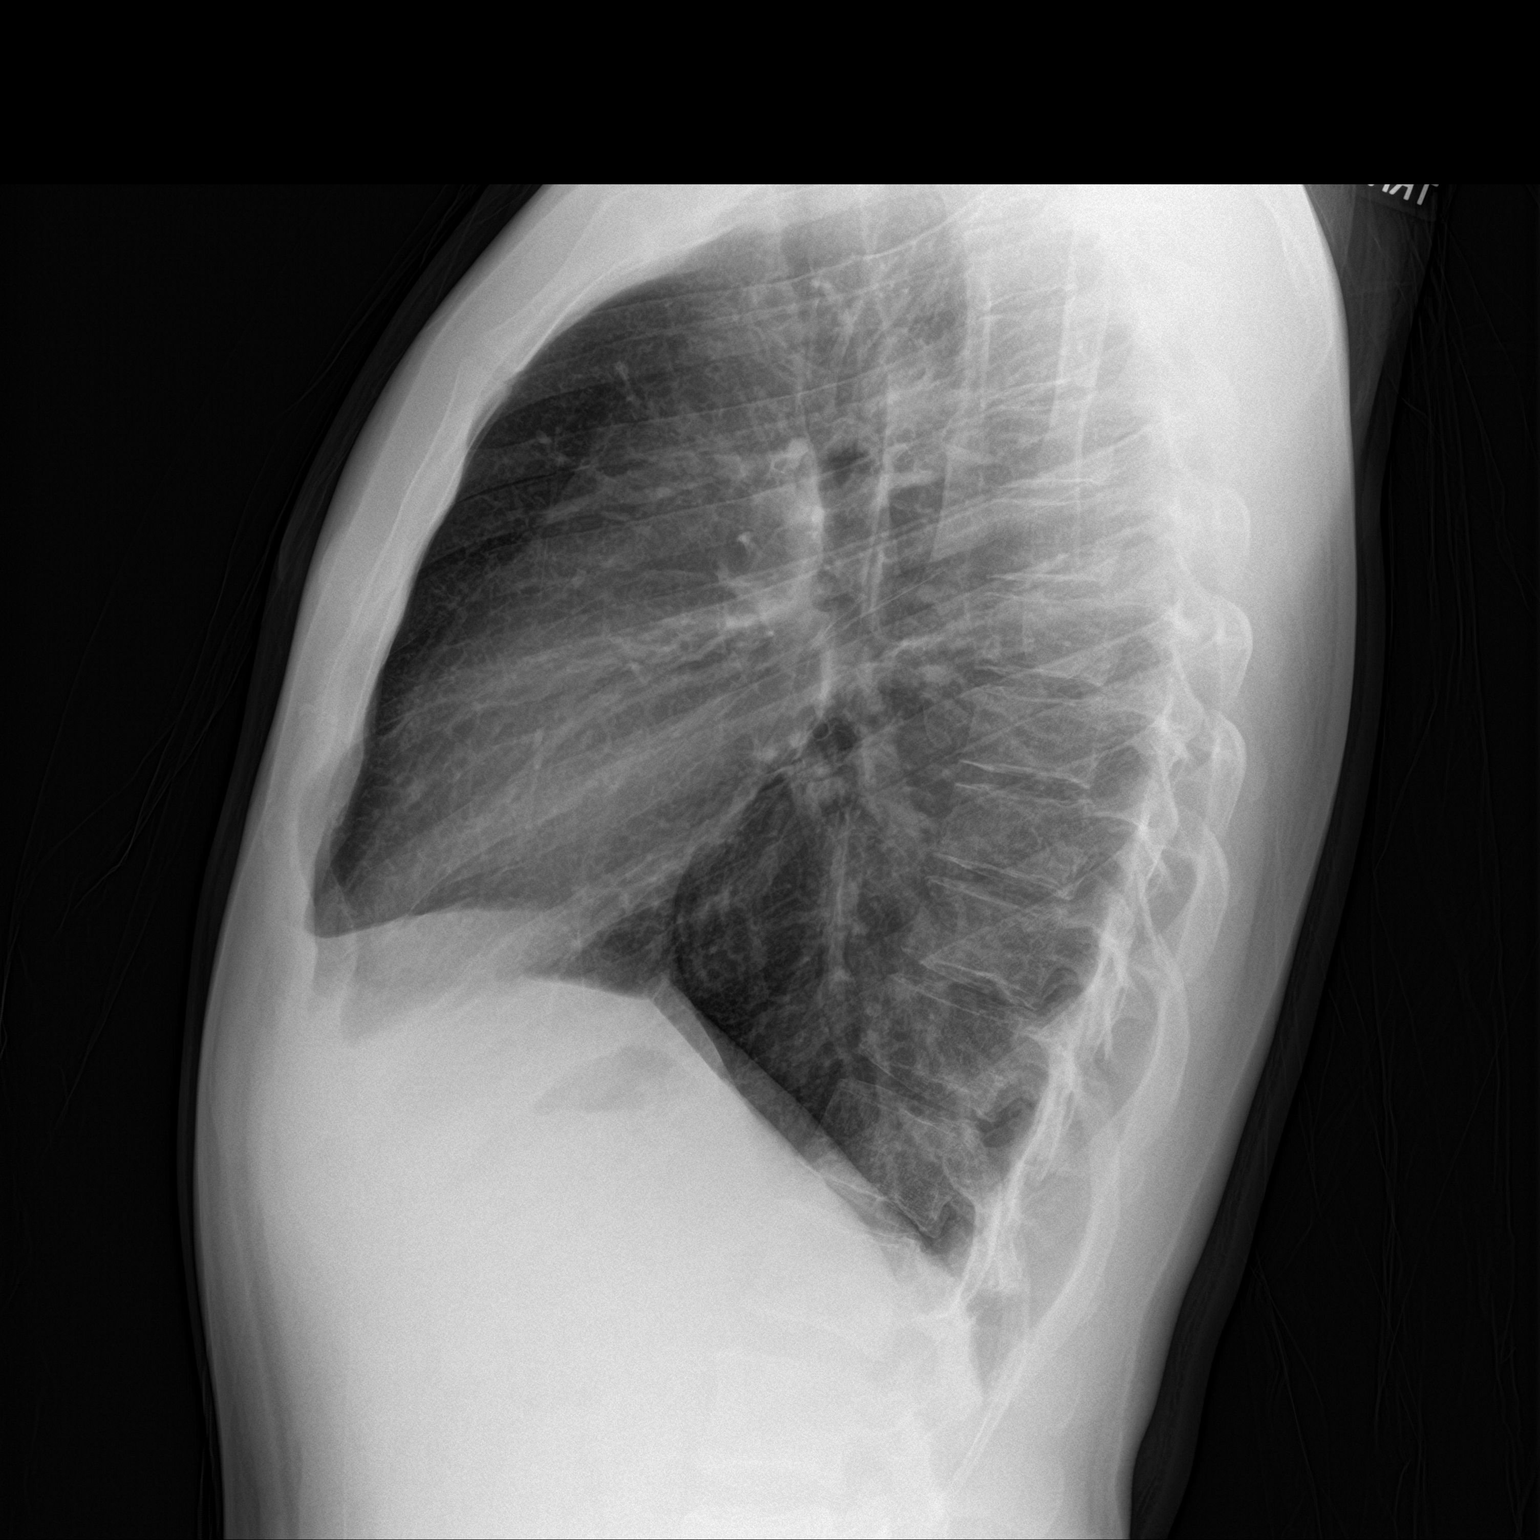

[2 of 2 positions shown; findings below may reference images not displayed]

FINDINGS: The heart size and mediastinal contours are within normal limits.
Both lungs are clear. The visualized skeletal structures are
unremarkable.
IMPRESSION: No active cardiopulmonary disease.  No rib fracture.

## 2021-04-01 IMAGING — CT CT ANGIO CHEST
2 of 12 series · 11 of 46 positions shown · IV contrast (APPLIED)
Comparison: 07/12/2019

CLINICAL DATA: Worsening shortness of breath, tachycardia,
tachypnea

EXAM:
CT ANGIOGRAPHY CHEST WITH CONTRAST
TECHNIQUE: Multidetector CT imaging of the chest was performed using the
standard protocol during bolus administration of intravenous
contrast. Multiplanar CT image reconstructions and MIPs were
obtained to evaluate the vascular anatomy.
CONTRAST:  75mL OMNIPAQUE IOHEXOL 350 MG/ML SOLN

[Series 5: thins · axial · 0.68mm/px · z∈[-255,-37]mm · 10 of 268 slices shown]
[im 25/268  lung]
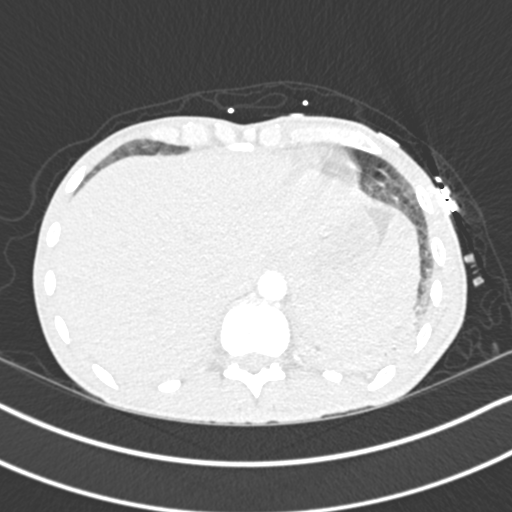
[im 49/268  soft-tissue]
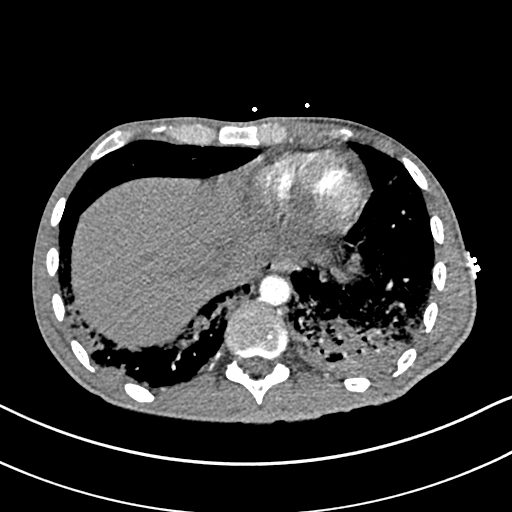
[im 73/268  lung]
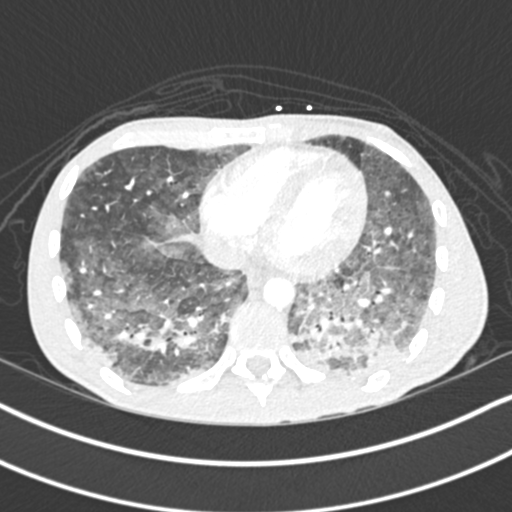
[im 98/268  soft-tissue]
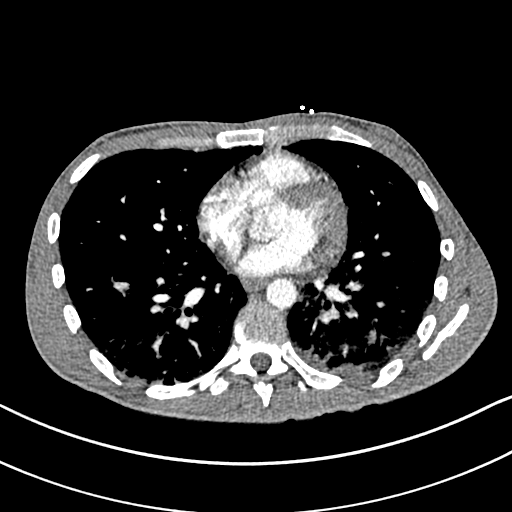
[im 122/268  lung]
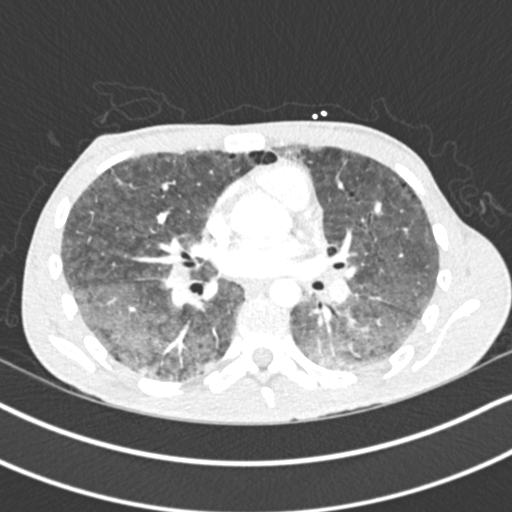
[im 146/268  soft-tissue]
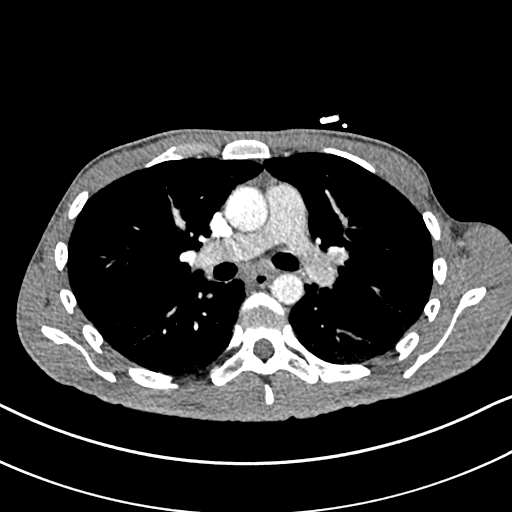
[im 170/268  lung]
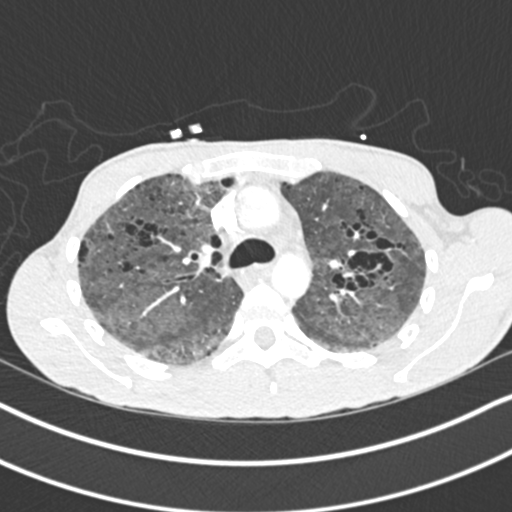
[im 195/268  soft-tissue]
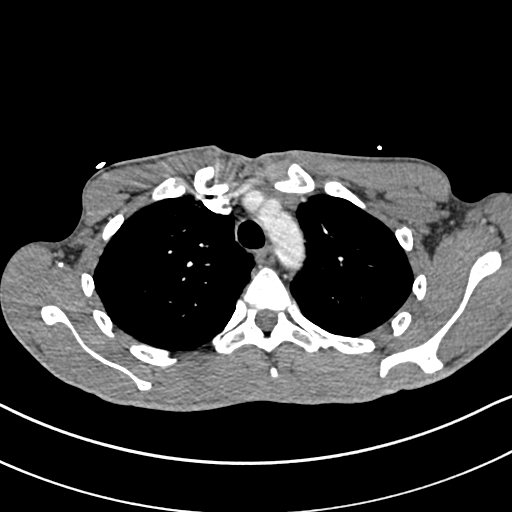
[im 219/268  lung]
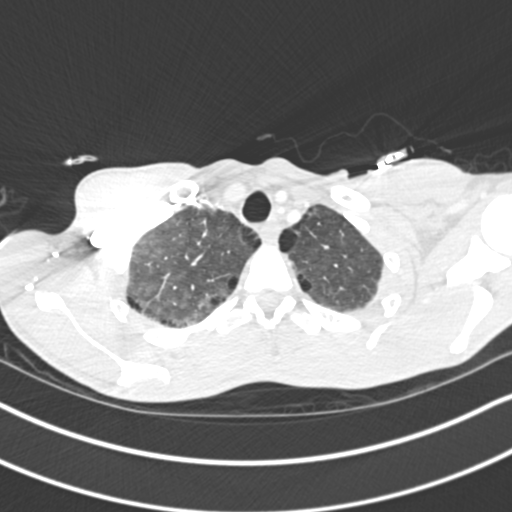
[im 243/268  soft-tissue]
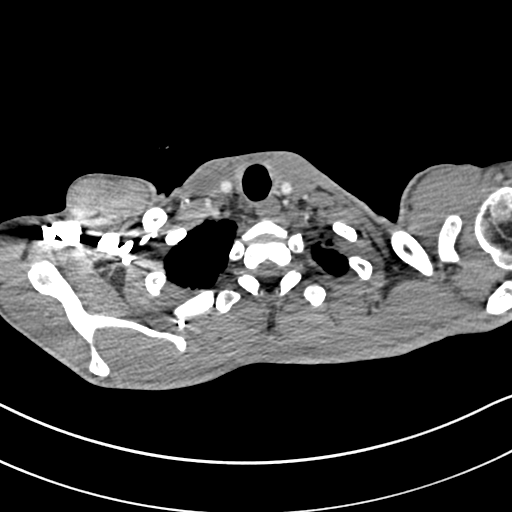

[Series 16: coronal mpr · coronal · 0.52mm/px · 1 of 70 slices shown]
[im 35/70  soft-tissue]
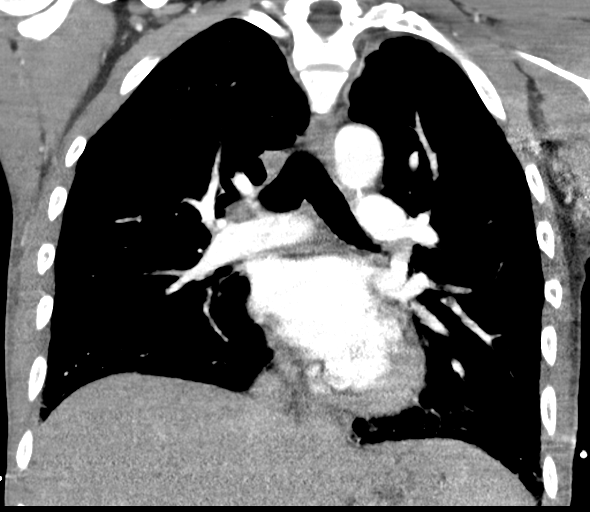

[11 of 46 positions shown; findings below may reference images not displayed]

FINDINGS: Cardiovascular: This is a technically adequate evaluation of the
pulmonary vasculature. No filling defects or pulmonary emboli. Heart
is unremarkable without pericardial effusion. Thoracic aorta is
unremarkable without aneurysm or dissection.

Mediastinum/Nodes: No enlarged mediastinal, hilar, or axillary lymph
nodes. Thyroid gland, trachea, and esophagus demonstrate no
significant findings.

Lungs/Pleura: Diffuse ground-glass airspace disease is seen
throughout the lungs, without lobar predilection. Denser
consolidation at the left lung base may reflect superimposed
atelectasis or dense airspace disease. Cystic changes are seen
within the upper lung zones. No effusion or pneumothorax. The
central airways are patent.

Upper Abdomen: Limited imaging through the upper abdomen is
unremarkable.

Musculoskeletal: There are no acute or destructive bony lesions.
Reconstructed images demonstrate no additional findings.

Review of the MIP images confirms the above findings.
IMPRESSION: 1. No evidence of pulmonary embolus.
2. Diffuse bilateral ground-glass airspace disease, with upper lobe
predominant cystic changes as above. If the patient is
immunocompromised, atypical infections such as pneumocystis could
give this pattern. Hypersensitivity pneumonitis, drug toxicity, or
nonspecific interstitial pneumonitis could give a similar pattern.
3. Dense left lower lobe consolidation which could reflect
atelectasis or superimposed bronchopneumonia.
